# Patient Record
Sex: Female | Born: 1965 | Race: Black or African American | Hispanic: No | Marital: Married | Smoking: Never smoker
Health system: Southern US, Community
[De-identification: ages and names within clinical notes are randomized; demographics above are authoritative.]

## PROBLEM LIST (undated history)

## (undated) DIAGNOSIS — E119 Type 2 diabetes mellitus without complications: Secondary | ICD-10-CM

## (undated) DIAGNOSIS — Z5189 Encounter for other specified aftercare: Secondary | ICD-10-CM

## (undated) DIAGNOSIS — I10 Essential (primary) hypertension: Secondary | ICD-10-CM

## (undated) DIAGNOSIS — D569 Thalassemia, unspecified: Secondary | ICD-10-CM

## (undated) DIAGNOSIS — E785 Hyperlipidemia, unspecified: Secondary | ICD-10-CM

## (undated) HISTORY — PX: APPENDECTOMY: SHX54

## (undated) HISTORY — DX: Type 2 diabetes mellitus without complications: E11.9

## (undated) HISTORY — DX: Essential (primary) hypertension: I10

## (undated) HISTORY — DX: Hyperlipidemia, unspecified: E78.5

## (undated) HISTORY — DX: Thalassemia, unspecified: D56.9

## (undated) HISTORY — DX: Encounter for other specified aftercare: Z51.89

---

## 2015-04-16 ENCOUNTER — Encounter (HOSPITAL_COMMUNITY): Payer: Self-pay | Admitting: *Deleted

## 2015-04-16 ENCOUNTER — Emergency Department (HOSPITAL_COMMUNITY)
Admission: EM | Admit: 2015-04-16 | Discharge: 2015-04-16 | Disposition: A | Discharge status: Home / Self Care | Payer: No Typology Code available for payment source | Attending: Emergency Medicine | Admitting: Emergency Medicine

## 2015-04-16 DIAGNOSIS — Y92410 Unspecified street and highway as the place of occurrence of the external cause: Secondary | ICD-10-CM | POA: Insufficient documentation

## 2015-04-16 DIAGNOSIS — S199XXA Unspecified injury of neck, initial encounter: Secondary | ICD-10-CM | POA: Insufficient documentation

## 2015-04-16 DIAGNOSIS — Y939 Activity, unspecified: Secondary | ICD-10-CM | POA: Insufficient documentation

## 2015-04-16 DIAGNOSIS — Y999 Unspecified external cause status: Secondary | ICD-10-CM | POA: Diagnosis not present

## 2015-04-16 DIAGNOSIS — S46912A Strain of unspecified muscle, fascia and tendon at shoulder and upper arm level, left arm, initial encounter: Secondary | ICD-10-CM | POA: Insufficient documentation

## 2015-04-16 DIAGNOSIS — S4992XA Unspecified injury of left shoulder and upper arm, initial encounter: Secondary | ICD-10-CM | POA: Diagnosis present

## 2015-04-16 DIAGNOSIS — S46812A Strain of other muscles, fascia and tendons at shoulder and upper arm level, left arm, initial encounter: Secondary | ICD-10-CM

## 2015-04-16 MED ORDER — NAPROXEN 500 MG PO TABS: 500.0000 mg | ORAL_TABLET | Freq: Two times a day (BID) | ORAL | Status: DC

## 2015-04-16 MED ORDER — METHOCARBAMOL 500 MG PO TABS: 500.0000 mg | ORAL_TABLET | Freq: Two times a day (BID) | ORAL | Status: DC

## 2015-04-16 NOTE — Discharge Instructions (Signed)
1. Medications: robaxin, naproxyn, usual home medications 2. Treatment: rest, drink plenty of fluids, gentle stretching as discussed, alternate ice and heat 3. Follow Up: Please followup with your primary doctor in 3 days for discussion of your diagnoses and further evaluation after today's visit; if you do not have a primary care doctor use the resource guide provided to find one;  Return to the ER for worsening back pain, difficulty walking, loss of bowel or bladder control or other concerning symptoms    Back Exercises Back exercises help treat and prevent back injuries. The goal of back exercises is to increase the strength of your abdominal and back muscles and the flexibility of your back. These exercises should be started when you no longer have back pain. Back exercises include:  Pelvic Tilt. Lie on your back with your knees bent. Tilt your pelvis until the lower part of your back is against the floor. Hold this position 5 to 10 sec and repeat 5 to 10 times.  Knee to Chest. Pull first 1 knee up against your chest and hold for 20 to 30 seconds, repeat this with the other knee, and then both knees. This may be done with the other leg straight or bent, whichever feels better.  Sit-Ups or Curl-Ups. Bend your knees 90 degrees. Start with tilting your pelvis, and do a partial, slow sit-up, lifting your trunk only 30 to 45 degrees off the floor. Take at least 2 to 3 seconds for each sit-up. Do not do sit-ups with your knees out straight. If partial sit-ups are difficult, simply do the above but with only tightening your abdominal muscles and holding it as directed.  Hip-Lift. Lie on your back with your knees flexed 90 degrees. Push down with your feet and shoulders as you raise your hips a couple inches off the floor; hold for 10 seconds, repeat 5 to 10 times.  Back arches. Lie on your stomach, propping yourself up on bent elbows. Slowly press on your hands, causing an arch in your low back. Repeat  3 to 5 times. Any initial stiffness and discomfort should lessen with repetition over time.  Shoulder-Lifts. Lie face down with arms beside your body. Keep hips and torso pressed to floor as you slowly lift your head and shoulders off the floor. Do not overdo your exercises, especially in the beginning. Exercises may cause you some mild back discomfort which lasts for a few minutes; however, if the pain is more severe, or lasts for more than 15 minutes, do not continue exercises until you see your caregiver. Improvement with exercise therapy for back problems is slow.  See your caregivers for assistance with developing a proper back exercise program. Document Released: 09/17/2004 Document Revised: 11/02/2011 Document Reviewed: 06/11/2011 [facility] Patient Information 2015 [place], [place]. This information is not intended to replace advice given to you by your health care provider. Make sure you discuss any questions you have with your health care provider.
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
## 2015-04-16 NOTE — ED Notes (Signed)
Declined W/C at D/C and was escorted to lobby by RN. 
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
## 2015-09-03 ENCOUNTER — Emergency Department (HOSPITAL_COMMUNITY)
Admission: EM | Admit: 2015-09-03 | Discharge: 2015-09-04 | Discharge status: Against Medical Advice | Payer: Self-pay | Attending: Emergency Medicine | Admitting: Emergency Medicine

## 2015-09-03 ENCOUNTER — Encounter (HOSPITAL_COMMUNITY): Payer: Self-pay | Admitting: Emergency Medicine

## 2015-09-03 DIAGNOSIS — R1030 Lower abdominal pain, unspecified: Secondary | ICD-10-CM | POA: Insufficient documentation

## 2015-09-03 DIAGNOSIS — R51 Headache: Secondary | ICD-10-CM | POA: Insufficient documentation

## 2015-09-03 LAB — COMPREHENSIVE METABOLIC PANEL
ALT: 20 U/L (ref 14–54)
AST: 24 U/L (ref 15–41)
Albumin: 3.9 g/dL (ref 3.5–5.0)
Alkaline Phosphatase: 83 U/L (ref 38–126)
Anion gap: 12 (ref 5–15)
BUN: 11 mg/dL (ref 6–20)
CO2: 25 mmol/L (ref 22–32)
Calcium: 9.5 mg/dL (ref 8.9–10.3)
Chloride: 101 mmol/L (ref 101–111)
Creatinine, Ser: 0.71 mg/dL (ref 0.44–1.00)
GFR calc Af Amer: >60 mL/min (ref >60)
GFR calc non Af Amer: >60 mL/min (ref >60)
Glucose, Bld: 131 mg/dL — ABNORMAL HIGH (ref 65–99)
Potassium: 3.8 mmol/L (ref 3.5–5.1)
Sodium: 138 mmol/L (ref 135–145)
Total Bilirubin: 0.3 mg/dL (ref 0.3–1.2)
Total Protein: 7.7 g/dL (ref 6.5–8.1)

## 2015-09-03 LAB — LIPASE, BLOOD: Lipase: 45 U/L (ref 11–51)

## 2015-09-03 LAB — CBC
HCT: 35 % — ABNORMAL LOW (ref 36.0–46.0)
Hemoglobin: 11.1 g/dL — ABNORMAL LOW (ref 12.0–15.0)
MCH: 22.3 pg — ABNORMAL LOW (ref 26.0–34.0)
MCHC: 31.7 g/dL (ref 30.0–36.0)
MCV: 70.4 fL — ABNORMAL LOW (ref 78.0–100.0)
Platelets: 96 10*3/uL — ABNORMAL LOW (ref 150–400)
RBC: 4.97 MIL/uL (ref 3.87–5.11)
RDW: 15.7 % — ABNORMAL HIGH (ref 11.5–15.5)
WBC: 3.7 10*3/uL — ABNORMAL LOW (ref 4.0–10.5)

## 2015-09-03 NOTE — ED Notes (Signed)
Pt states last night she started having some lower abd pain across her abd and generalized body aches. Pt also reports a headache and states she just feels cold.

## 2015-09-03 NOTE — ED Notes (Signed)
Pt stated that she couldn't wait any longer and that she is uncomfortable. I explained the wait to the pt and how long the current time is and how many people are ahead of her. Pt stated that she wasn't going to wait

## 2015-09-04 NOTE — ED Notes (Signed)
Pt returned to be seen by the doctor. Pt moved back into waiting room

## 2015-09-04 NOTE — ED Notes (Signed)
No asnwer x 2

## 2015-09-04 NOTE — ED Notes (Signed)
Pts name called for a room no answer 

## 2015-10-30 ENCOUNTER — Inpatient Hospital Stay (HOSPITAL_COMMUNITY)
Admission: AD | Admit: 2015-10-30 | Discharge: 2015-10-31 | Disposition: A | Discharge status: Home / Self Care | Payer: No Typology Code available for payment source | Source: Ambulatory Visit | Attending: Obstetrics and Gynecology | Admitting: Obstetrics and Gynecology

## 2015-10-30 ENCOUNTER — Encounter (HOSPITAL_COMMUNITY): Payer: Self-pay | Admitting: *Deleted

## 2015-10-30 DIAGNOSIS — R109 Unspecified abdominal pain: Secondary | ICD-10-CM | POA: Insufficient documentation

## 2015-10-30 DIAGNOSIS — K219 Gastro-esophageal reflux disease without esophagitis: Secondary | ICD-10-CM | POA: Insufficient documentation

## 2015-10-30 LAB — COMPREHENSIVE METABOLIC PANEL
ALT: 21 U/L (ref 14–54)
AST: 23 U/L (ref 15–41)
Albumin: 3.9 g/dL (ref 3.5–5.0)
Alkaline Phosphatase: 72 U/L (ref 38–126)
Anion gap: 3 — ABNORMAL LOW (ref 5–15)
BUN: 10 mg/dL (ref 6–20)
CO2: 28 mmol/L (ref 22–32)
Calcium: 8.8 mg/dL — ABNORMAL LOW (ref 8.9–10.3)
Chloride: 108 mmol/L (ref 101–111)
Creatinine, Ser: 0.63 mg/dL (ref 0.44–1.00)
GFR calc Af Amer: >60 mL/min (ref >60)
GFR calc non Af Amer: >60 mL/min (ref >60)
Glucose, Bld: 134 mg/dL — ABNORMAL HIGH (ref 65–99)
Potassium: 3.9 mmol/L (ref 3.5–5.1)
Sodium: 139 mmol/L (ref 135–145)
Total Bilirubin: 0.6 mg/dL (ref 0.3–1.2)
Total Protein: 7.4 g/dL (ref 6.5–8.1)

## 2015-10-30 LAB — URINALYSIS, ROUTINE W REFLEX MICROSCOPIC
Bilirubin Urine: NEGATIVE
Glucose, UA: NEGATIVE mg/dL
Hgb urine dipstick: NEGATIVE
Ketones, ur: NEGATIVE mg/dL
Leukocytes, UA: NEGATIVE
Nitrite: NEGATIVE
Protein, ur: NEGATIVE mg/dL
Specific Gravity, Urine: <1.005 — ABNORMAL LOW (ref 1.005–1.030)
pH: 6 (ref 5.0–8.0)

## 2015-10-30 LAB — AMYLASE: Amylase: 116 U/L — ABNORMAL HIGH (ref 28–100)

## 2015-10-30 LAB — CBC
HCT: 33.2 % — ABNORMAL LOW (ref 36.0–46.0)
Hemoglobin: 10.3 g/dL — ABNORMAL LOW (ref 12.0–15.0)
MCH: 21.7 pg — ABNORMAL LOW (ref 26.0–34.0)
MCHC: 31 g/dL (ref 30.0–36.0)
MCV: 69.9 fL — ABNORMAL LOW (ref 78.0–100.0)
Platelets: 95 10*3/uL — ABNORMAL LOW (ref 150–400)
RBC: 4.75 MIL/uL (ref 3.87–5.11)
RDW: 15.9 % — ABNORMAL HIGH (ref 11.5–15.5)
WBC: 4.6 10*3/uL (ref 4.0–10.5)

## 2015-10-30 LAB — POCT PREGNANCY, URINE: Preg Test, Ur: NEGATIVE

## 2015-10-30 LAB — LIPASE, BLOOD: Lipase: 39 U/L (ref 11–51)

## 2015-10-30 MED ORDER — GI COCKTAIL ~~LOC~~
30.0000 mL | Freq: Once | ORAL | Status: AC
  Administered 2015-10-30: 30 mL via ORAL
  Filled 2015-10-30: qty 30

## 2015-10-30 MED ORDER — FAMOTIDINE 20 MG PO TABS: 20.0000 mg | ORAL_TABLET | Freq: Two times a day (BID) | ORAL | Status: DC

## 2015-10-30 NOTE — MAU Note (Signed)
Pt. States for a few days had indigestion. Had vomiting after brushing her teeth for 3-4 days. Yesterday started to have abdominal pain that was severe. Pt. States she has been drinking apple cider vinegar and was unsure if the reflux was caused by this. Also, has not been eating a lot. Denies bleeding and denies abnormal discharge. Unable to stand up right or stretch. Here for evaluation.

## 2015-10-30 NOTE — Discharge Instructions (Signed)

## 2015-10-30 NOTE — MAU Provider Note (Signed)
History     CSN: [id number]  Arrival date and time: 10/30/15 1831   First Provider Initiated Contact with Patient 10/30/15 1911      Chief Complaint  Patient presents with  . Abdominal Pain   Abdominal Pain This is a new problem. The current episode started yesterday. The onset quality is gradual. The problem occurs constantly. The pain is located in the periumbilical region and suprapubic region. The pain is moderate. The quality of the pain is sharp. The abdominal pain radiates to the periumbilical region. Associated symptoms include vomiting (when brushing teeth ). Pertinent negatives include no constipation (last BM a couple hours PTA ), diarrhea, dysuria, fever or nausea. The pain is aggravated by deep breathing, movement and certain positions. The pain is relieved by being still. Treatments tried: pepto-bismul  The treatment provided no relief. Her past medical history is significant for GERD. There is no history of pancreatitis.     Past Medical History  Diagnosis Date  . Medical history non-contributory     Past Surgical History  Procedure Laterality Date  . Appendectomy    . Cesarean section      History reviewed. No pertinent family history.  Social History  Substance Use Topics  . Smoking status: Never Smoker   . Smokeless tobacco: None  . Alcohol Use: No    Allergies:  Allergies  Allergen Reactions  . Sulfa Antibiotics Hives    Prescriptions prior to admission  Medication Sig Dispense Refill Last Dose  . diphenhydramine-acetaminophen (TYLENOL PM) 25-500 MG TABS tablet Take 2 tablets by mouth at bedtime as needed (pain).   Past Week at Unknown time  . methocarbamol (ROBAXIN) 500 MG tablet Take 1 tablet (500 mg total) by mouth 2 (two) times daily. (Patient not taking: Reported on 10/30/2015) 20 tablet 0   . naproxen (NAPROSYN) 500 MG tablet Take 1 tablet (500 mg total) by mouth 2 (two) times daily with a meal. (Patient not taking: Reported on 10/30/2015) 30  tablet 0     Review of Systems  Constitutional: Negative for fever and chills.  Gastrointestinal: Positive for vomiting (when brushing teeth ) and abdominal pain. Negative for nausea, diarrhea and constipation (last BM a couple hours PTA ).  Genitourinary: Negative for dysuria.   Physical Exam   Blood pressure 159/100, pulse 81, temperature 97.9 F (36.6 C), temperature source Oral, resp. rate 18.  Physical Exam  Nursing note and vitals reviewed. Constitutional: She is oriented to person, place, and time. She appears well-developed and well-nourished. No distress.  HENT:  Head: Normocephalic.  Cardiovascular: Normal rate, regular rhythm, normal heart sounds and intact distal pulses.   Respiratory: Effort normal and breath sounds normal.  GI: Soft. Bowel sounds are normal. There is tenderness (througout, RLQ, LLQ and periumbilical ).  Neurological: She is alert and oriented to person, place, and time.  Skin: Skin is warm and dry.  Psychiatric: She has a normal mood and affect.   Results for orders placed or performed during the hospital encounter of 10/30/15 (from the past 24 hour(s))  Urinalysis, Routine w reflex microscopic (not at [facility])     Status: Abnormal   Collection Time: 10/30/15  6:46 PM  Result Value Ref Range   Color, Urine STRAW (A) YELLOW   APPearance CLEAR CLEAR   Specific Gravity, Urine <1.005 (L) 1.005 - 1.030   pH 6.0 5.0 - 8.0   Glucose, UA NEGATIVE NEGATIVE mg/dL   Hgb urine dipstick NEGATIVE NEGATIVE   Bilirubin Urine NEGATIVE  NEGATIVE   Ketones, ur NEGATIVE NEGATIVE mg/dL   Protein, ur NEGATIVE NEGATIVE mg/dL   Nitrite NEGATIVE NEGATIVE   Leukocytes, UA NEGATIVE NEGATIVE  Pregnancy, urine POC     Status: None   Collection Time: 10/30/15  6:58 PM  Result Value Ref Range   Preg Test, Ur NEGATIVE NEGATIVE  CBC     Status: Abnormal   Collection Time: 10/30/15  7:40 PM  Result Value Ref Range   WBC 4.6 4.0 - 10.5 K/uL   RBC 4.75 3.87 - 5.11 MIL/uL    Hemoglobin 10.3 (L) 12.0 - 15.0 g/dL   HCT 33.2 (L) 36.0 - 46.0 %   MCV 69.9 (L) 78.0 - 100.0 fL   MCH 21.7 (L) 26.0 - 34.0 pg   MCHC 31.0 30.0 - 36.0 g/dL   RDW 15.9 (H) 11.5 - 15.5 %   Platelets 95 (L) 150 - 400 K/uL  Comprehensive metabolic panel     Status: Abnormal   Collection Time: 10/30/15  7:40 PM  Result Value Ref Range   Sodium 139 135 - 145 mmol/L   Potassium 3.9 3.5 - 5.1 mmol/L   Chloride 108 101 - 111 mmol/L   CO2 28 22 - 32 mmol/L   Glucose, Bld 134 (H) 65 - 99 mg/dL   BUN 10 6 - 20 mg/dL   Creatinine, Ser 0.63 0.44 - 1.00 mg/dL   Calcium 8.8 (L) 8.9 - 10.3 mg/dL   Total Protein 7.4 6.5 - 8.1 g/dL   Albumin 3.9 3.5 - 5.0 g/dL   AST 23 15 - 41 U/L   ALT 21 14 - 54 U/L   Alkaline Phosphatase 72 38 - 126 U/L   Total Bilirubin 0.6 0.3 - 1.2 mg/dL   GFR calc non Af Amer >60 >60 mL/min   GFR calc Af Amer >60 >60 mL/min   Anion gap 3 (L) 5 - 15  Amylase     Status: Abnormal   Collection Time: 10/30/15  7:40 PM  Result Value Ref Range   Amylase 116 (H) 28 - 100 U/L  Lipase, blood     Status: None   Collection Time: 10/30/15  7:40 PM  Result Value Ref Range   Lipase 39 11 - 51 U/L    MAU Course  Procedures  MDM 2138: Patient has had GI cocktail. She reports that her pain is better at this time.   Assessment and Plan   1. Gastroesophageal reflux disease, esophagitis presence not specified    DC home Comfort measures reviewed  RX: Pepcid 20mg  BID #30    Follow-up Information    Follow up with [facility].   Specialty:  Urgent Care   Why:  If symptoms worsen   Contact information:   [address] [phone]       Mathis Bud 10/30/2015, 8:17 PM

## 2015-11-01 ENCOUNTER — Inpatient Hospital Stay (HOSPITAL_COMMUNITY)
Admission: EM | Admit: 2015-11-01 | Discharge: 2015-11-04 | DRG: 392 | Disposition: A | Discharge status: Home / Self Care | Payer: Self-pay | Attending: Internal Medicine | Admitting: Internal Medicine

## 2015-11-01 ENCOUNTER — Emergency Department (HOSPITAL_COMMUNITY): Payer: Self-pay

## 2015-11-01 ENCOUNTER — Encounter (HOSPITAL_COMMUNITY): Payer: Self-pay

## 2015-11-01 DIAGNOSIS — Q433 Congenital malformations of intestinal fixation: Secondary | ICD-10-CM

## 2015-11-01 DIAGNOSIS — D259 Leiomyoma of uterus, unspecified: Secondary | ICD-10-CM | POA: Diagnosis present

## 2015-11-01 DIAGNOSIS — I10 Essential (primary) hypertension: Secondary | ICD-10-CM | POA: Diagnosis present

## 2015-11-01 DIAGNOSIS — D569 Thalassemia, unspecified: Secondary | ICD-10-CM | POA: Insufficient documentation

## 2015-11-01 DIAGNOSIS — Z882 Allergy status to sulfonamides status: Secondary | ICD-10-CM

## 2015-11-01 DIAGNOSIS — D696 Thrombocytopenia, unspecified: Secondary | ICD-10-CM | POA: Diagnosis present

## 2015-11-01 DIAGNOSIS — K529 Noninfective gastroenteritis and colitis, unspecified: Principal | ICD-10-CM | POA: Diagnosis present

## 2015-11-01 DIAGNOSIS — N760 Acute vaginitis: Secondary | ICD-10-CM | POA: Diagnosis present

## 2015-11-01 DIAGNOSIS — D509 Iron deficiency anemia, unspecified: Secondary | ICD-10-CM | POA: Diagnosis present

## 2015-11-01 DIAGNOSIS — R1084 Generalized abdominal pain: Secondary | ICD-10-CM | POA: Insufficient documentation

## 2015-11-01 DIAGNOSIS — Z79899 Other long term (current) drug therapy: Secondary | ICD-10-CM

## 2015-11-01 DIAGNOSIS — K8020 Calculus of gallbladder without cholecystitis without obstruction: Secondary | ICD-10-CM | POA: Diagnosis present

## 2015-11-01 LAB — WET PREP, GENITAL
Sperm: NONE SEEN
Trich, Wet Prep: NONE SEEN
Yeast Wet Prep HPF POC: NONE SEEN

## 2015-11-01 LAB — URINALYSIS, ROUTINE W REFLEX MICROSCOPIC
Bilirubin Urine: NEGATIVE
Glucose, UA: >1000 mg/dL — AB
Hgb urine dipstick: NEGATIVE
Ketones, ur: NEGATIVE mg/dL
Leukocytes, UA: NEGATIVE
Nitrite: NEGATIVE
Protein, ur: NEGATIVE mg/dL
Specific Gravity, Urine: 1.024 (ref 1.005–1.030)
pH: 5.5 (ref 5.0–8.0)

## 2015-11-01 LAB — CBC
HCT: 36 % (ref 36.0–46.0)
Hemoglobin: 11 g/dL — ABNORMAL LOW (ref 12.0–15.0)
MCH: 21.8 pg — ABNORMAL LOW (ref 26.0–34.0)
MCHC: 30.6 g/dL (ref 30.0–36.0)
MCV: 71.3 fL — ABNORMAL LOW (ref 78.0–100.0)
Platelets: 105 10*3/uL — ABNORMAL LOW (ref 150–400)
RBC: 5.05 MIL/uL (ref 3.87–5.11)
RDW: 15.7 % — ABNORMAL HIGH (ref 11.5–15.5)
WBC: 4.5 10*3/uL (ref 4.0–10.5)

## 2015-11-01 LAB — I-STAT BETA HCG BLOOD, ED (MC, WL, AP ONLY)
I-stat hCG, quantitative: <5 m[IU]/mL (ref <5)
I-stat hCG, quantitative: <5 m[IU]/mL (ref <5)

## 2015-11-01 LAB — COMPREHENSIVE METABOLIC PANEL
ALT: 20 U/L (ref 14–54)
AST: 20 U/L (ref 15–41)
Albumin: 4.1 g/dL (ref 3.5–5.0)
Alkaline Phosphatase: 72 U/L (ref 38–126)
Anion gap: 7 (ref 5–15)
BUN: 11 mg/dL (ref 6–20)
CO2: 26 mmol/L (ref 22–32)
Calcium: 9.3 mg/dL (ref 8.9–10.3)
Chloride: 107 mmol/L (ref 101–111)
Creatinine, Ser: 0.66 mg/dL (ref 0.44–1.00)
GFR calc Af Amer: >60 mL/min (ref >60)
GFR calc non Af Amer: >60 mL/min (ref >60)
Glucose, Bld: 204 mg/dL — ABNORMAL HIGH (ref 65–99)
Potassium: 3.6 mmol/L (ref 3.5–5.1)
Sodium: 140 mmol/L (ref 135–145)
Total Bilirubin: 0.3 mg/dL (ref 0.3–1.2)
Total Protein: 7.8 g/dL (ref 6.5–8.1)

## 2015-11-01 LAB — CBC WITH DIFFERENTIAL/PLATELET
Basophils Absolute: 0 10*3/uL (ref 0.0–0.1)
Basophils Relative: 0 %
Eosinophils Absolute: 0.1 10*3/uL (ref 0.0–0.7)
Eosinophils Relative: 2 %
HCT: 40.9 % (ref 36.0–46.0)
Hemoglobin: 12.4 g/dL (ref 12.0–15.0)
Lymphocytes Relative: 34 %
Lymphs Abs: 1 10*3/uL (ref 0.7–4.0)
MCH: 21.6 pg — ABNORMAL LOW (ref 26.0–34.0)
MCHC: 30.3 g/dL (ref 30.0–36.0)
MCV: 71.4 fL — ABNORMAL LOW (ref 78.0–100.0)
Monocytes Absolute: 0.2 10*3/uL (ref 0.1–1.0)
Monocytes Relative: 6 %
Neutro Abs: 1.7 10*3/uL (ref 1.7–7.7)
Neutrophils Relative %: 58 %
Platelets: 83 10*3/uL — ABNORMAL LOW (ref 150–400)
RBC: 5.73 MIL/uL — ABNORMAL HIGH (ref 3.87–5.11)
RDW: 15.8 % — ABNORMAL HIGH (ref 11.5–15.5)
WBC: 3 10*3/uL — ABNORMAL LOW (ref 4.0–10.5)

## 2015-11-01 LAB — URINE MICROSCOPIC-ADD ON
Bacteria, UA: NONE SEEN
RBC / HPF: NONE SEEN RBC/hpf (ref 0–5)
WBC, UA: NONE SEEN WBC/hpf (ref 0–5)

## 2015-11-01 LAB — LIPASE, BLOOD: Lipase: 43 U/L (ref 11–51)

## 2015-11-01 MED ORDER — MORPHINE SULFATE (PF) 4 MG/ML IV SOLN
4.0000 mg | Freq: Once | INTRAVENOUS | Status: AC
  Administered 2015-11-01: 4 mg via INTRAVENOUS
  Filled 2015-11-01: qty 1

## 2015-11-01 MED ORDER — SODIUM CHLORIDE 0.9 % IV BOLUS (SEPSIS)
1000.0000 mL | Freq: Once | INTRAVENOUS | Status: AC
  Administered 2015-11-01: 1000 mL via INTRAVENOUS

## 2015-11-01 MED ORDER — IOHEXOL 300 MG/ML  SOLN
100.0000 mL | Freq: Once | INTRAMUSCULAR | Status: AC | PRN
  Administered 2015-11-01: 100 mL via INTRAVENOUS

## 2015-11-01 NOTE — ED Notes (Signed)
Patient c/o generalized abdominal pain. Patient denies N/V/D. Patient was seen at [facility] 2 days ago and was diagnosed with GERD. Patient states she was given Pepcid, but is not helping. Patient states the pain is worse.

## 2015-11-01 NOTE — ED Provider Notes (Signed)
CSN: [id number]     Arrival date & time 11/01/15  1513 History   First MD Initiated Contact with Patient 11/01/15 2039     Chief Complaint  Patient presents with  . Abdominal Pain     (Consider location/radiation/quality/duration/timing/severity/associated sxs/prior Treatment) HPI Patient has had pain for approximately 4 days. She reports pain was initially epigastric. She reports it has gotten worse and now spread out to encompass more for central abdomen. She has had occasional vomiting but none recurrently. She reports bowel movements were normal but yesterday it looked more dark. No pain burning or urgency as urination. No vaginal discharge or bleeding. Pain is worse with movements. She has got no relief with Pepcid. She has history of appendectomy and C-section. Otherwise healthy. Past Medical History  Diagnosis Date  . Medical history non-contributory    Past Surgical History  Procedure Laterality Date  . Appendectomy    . Cesarean section     Family History  Problem Relation Age of Onset  . Family history unknown: Yes   Social History  Substance Use Topics  . Smoking status: Never Smoker   . Smokeless tobacco: Never Used  . Alcohol Use: No   OB History    Gravida Para Term Preterm AB TAB SAB Ectopic Multiple Living   6         2      Obstetric Comments   History of stillbirth     Review of Systems  10 Systems reviewed and are negative for acute change except as noted in the HPI.   Allergies  Sulfa antibiotics  Home Medications   Prior to Admission medications   Medication Sig Start Date End Date Taking? Authorizing Provider  diphenhydramine-acetaminophen (TYLENOL PM) 25-500 MG TABS tablet Take 2 tablets by mouth at bedtime as needed (pain).   Yes Historical Provider, MD  famotidine (PEPCID) 20 MG tablet Take 1 tablet (20 mg total) by mouth 2 (two) times daily. 10/30/15  Yes Heather D Hogan, CNM   BP 148/99 mmHg  Pulse 79  Temp(Src) 97.5 F (36.4 C) (Oral)   Resp 18  Ht 5\' 4"  (1.626 m)  Wt 170 lb (77.111 kg)  BMI 29.17 kg/m2  SpO2 99%  LMP  (LMP Unknown) Physical Exam  Constitutional: She is oriented to person, place, and time. She appears well-developed and well-nourished.  HENT:  Head: Normocephalic and atraumatic.  Eyes: EOM are normal. Pupils are equal, round, and reactive to light.  Neck: Neck supple.  Cardiovascular: Normal rate, regular rhythm, normal heart sounds and intact distal pulses.   Pulmonary/Chest: Effort normal and breath sounds normal.  Abdominal: Soft. Bowel sounds are normal. She exhibits no distension. There is tenderness.  Moderate to severe abdominal pain from the epigastrium to central lower abdomen. Voluntary guarding.  Genitourinary:  Normal external female genitalia. Speculum examination, white discharge in the vaginal vault. Cervix is nonfriable. Bimanual examination, mild diffuse uterine tenderness. Adnexa nontender.  Musculoskeletal: Normal range of motion. She exhibits tenderness. She exhibits no edema.  Neurological: She is alert and oriented to person, place, and time. She has normal strength. Coordination normal. GCS eye subscore is 4. GCS verbal subscore is 5. GCS motor subscore is 6.  Skin: Skin is warm, dry and intact.  Psychiatric: She has a normal mood and affect.    ED Course  Procedures (including critical care time) Labs Review Labs Reviewed  WET PREP, GENITAL - Abnormal; Notable for the following:    Clue Cells Wet Prep HPF  POC MANY (*)    WBC, Wet Prep HPF POC FEW (*)    All other components within normal limits  COMPREHENSIVE METABOLIC PANEL - Abnormal; Notable for the following:    Glucose, Bld 204 (*)    All other components within normal limits  CBC - Abnormal; Notable for the following:    Hemoglobin 11.0 (*)    MCV 71.3 (*)    MCH 21.8 (*)    RDW 15.7 (*)    Platelets 105 (*)    All other components within normal limits  URINALYSIS, ROUTINE W REFLEX MICROSCOPIC (NOT AT [facility]) -  Abnormal; Notable for the following:    APPearance CLOUDY (*)    Glucose, UA >1000 (*)    All other components within normal limits  URINE MICROSCOPIC-ADD ON - Abnormal; Notable for the following:    Squamous Epithelial / LPF 6-30 (*)    All other components within normal limits  CBC WITH DIFFERENTIAL/PLATELET - Abnormal; Notable for the following:    WBC 3.0 (*)    RBC 5.73 (*)    MCV 71.4 (*)    MCH 21.6 (*)    RDW 15.8 (*)    Platelets 83 (*)    All other components within normal limits  GASTROINTESTINAL PANEL BY PCR, STOOL (REPLACES STOOL CULTURE)  LIPASE, BLOOD  OCCULT BLOOD X 1 CARD TO LAB, STOOL  I-STAT BETA HCG BLOOD, ED ([facility], [facility], [facility] ONLY)  I-STAT BETA HCG BLOOD, ED ([facility], [facility], [facility] ONLY)  GC/CHLAMYDIA PROBE AMP () NOT AT [facility]    Imaging Review Ct Abdomen Pelvis W Contrast  11/01/2015  CLINICAL DATA:  50 year old female with generalized abdominal pain. No current nausea, vomiting or diarrhea. EXAM: CT ABDOMEN AND PELVIS WITH CONTRAST TECHNIQUE: Multidetector CT imaging of the abdomen and pelvis was performed using the standard protocol following bolus administration of intravenous contrast. CONTRAST:  115mL OMNIPAQUE IOHEXOL 300 MG/ML  SOLN COMPARISON:  No priors. FINDINGS: Lower chest:  Unremarkable. Hepatobiliary: No cystic or solid hepatic lesions. No intra or extrahepatic biliary ductal dilatation. Calcified gallstones in the gallbladder. No current findings to suggest an acute cholecystitis at this time. Pancreas: No pancreatic mass. No pancreatic ductal dilatation. No pancreatic or peripancreatic fluid or inflammatory changes. Spleen: Unremarkable. Adrenals/Urinary Tract: 10 mm simple cyst in the upper pole of the right kidney. Left kidney and bilateral adrenal glands are normal in appearance. No hydroureteronephrosis. Urinary bladder is normal in appearance. Stomach/Bowel: Normal appearance of the stomach. No pathologic dilatation of small bowel or colon. There are  several loops of small bowel including the third and fourth portions of the duodenum, and proximal aspects of the jejunum which appear thickened, best appreciated in the right side of the abdomen on image 45 of series 2. Although the duodenum crosses the midline beneath the superior mesenteric artery, the third/fourth portion of the duodenum do not extend upward and the duodenal jejunal junction lie is well below the level of the pylorus. 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 Colon is normally located. Appendix is not confidently identified, likely surgically absent. Regardless, there are no inflammatory changes adjacent to the cecum to suggest presence of an acute appendicitis at this time. Vascular/Lymphatic: Minimal atherosclerotic disease is noted throughout the abdominal and pelvic vasculature, without evidence of aneurysm or dissection. No lymphadenopathy is noted in the abdomen or pelvis. Reproductive: Uterus is slightly heterogeneous in appearance, likely to  reflect the presence of multiple small fibroids, largest of which is in the right side of the uterine body measuring up to 3.8 cm. Ovaries are unremarkable in appearance. Other: No significant volume of ascites.  No pneumoperitoneum. Musculoskeletal: There are no aggressive appearing lytic or blastic lesions noted in the visualized portions of the skeleton. IMPRESSION: 1. Mural thickening of small bowel involving portions of the duodenum and proximal jejunum, as above, suggestive of enteritis. 2. No findings to suggest small bowel obstruction at this time. 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 No findings to suggest frank midgut volvulus. 3. Cholelithiasis without evidence of acute cholecystitis at this time. 4. Fibroid uterus. 5. Additional incidental findings, as above. Electronically Signed   By:  Vinnie Langton M.D.   On: 11/01/2015 23:38   I have personally reviewed and evaluated these images and lab results as part of my medical decision-making.   EKG Interpretation None     Consult: Discussed with general surgery Dr. Brantley Stage, at this point will admit the patient for fluids and pain control. Patient will be seen in the morning for consult. CT does not show immediate surgical condition. MDM   Final diagnoses:  Generalized abdominal pain  Enteritis   Patient has had worsening abdominal pain for 4 days. Past surgical history is for appendectomy and C-section with otherwise healthy 50 year old female. CT shows several areas of inflammatory change but no obstruction identified. With continued pain, the patient will be admitted for IV fluids and pain control with surgical consultation.    Charlesetta Shanks, MD 11/02/15 [phone]

## 2015-11-02 ENCOUNTER — Encounter (HOSPITAL_COMMUNITY): Payer: Self-pay | Admitting: Rehabilitation

## 2015-11-02 DIAGNOSIS — D259 Leiomyoma of uterus, unspecified: Secondary | ICD-10-CM | POA: Insufficient documentation

## 2015-11-02 DIAGNOSIS — D696 Thrombocytopenia, unspecified: Secondary | ICD-10-CM

## 2015-11-02 DIAGNOSIS — K529 Noninfective gastroenteritis and colitis, unspecified: Principal | ICD-10-CM

## 2015-11-02 DIAGNOSIS — D509 Iron deficiency anemia, unspecified: Secondary | ICD-10-CM

## 2015-11-02 DIAGNOSIS — R1084 Generalized abdominal pain: Secondary | ICD-10-CM | POA: Insufficient documentation

## 2015-11-02 DIAGNOSIS — D569 Thalassemia, unspecified: Secondary | ICD-10-CM | POA: Insufficient documentation

## 2015-11-02 MED ORDER — ONDANSETRON HCL 4 MG/2ML IJ SOLN: 4.0000 mg | Freq: Four times a day (QID) | INTRAMUSCULAR | Status: DC | PRN

## 2015-11-02 MED ORDER — CIPROFLOXACIN IN D5W 400 MG/200ML IV SOLN
400.0000 mg | Freq: Two times a day (BID) | INTRAVENOUS | Status: DC
  Administered 2015-11-02 – 2015-11-04 (×4): 400 mg via INTRAVENOUS
  Filled 2015-11-02 (×6): qty 200

## 2015-11-02 MED ORDER — MORPHINE SULFATE (PF) 4 MG/ML IV SOLN
4.0000 mg | Freq: Once | INTRAVENOUS | Status: AC
  Administered 2015-11-02: 4 mg via INTRAVENOUS
  Filled 2015-11-02: qty 1

## 2015-11-02 MED ORDER — MORPHINE SULFATE (PF) 2 MG/ML IV SOLN
2.0000 mg | INTRAVENOUS | Status: DC | PRN
  Administered 2015-11-02 – 2015-11-04 (×2): 2 mg via INTRAVENOUS
  Filled 2015-11-02 (×2): qty 1

## 2015-11-02 MED ORDER — METRONIDAZOLE IN NACL 5-0.79 MG/ML-% IV SOLN
500.0000 mg | Freq: Three times a day (TID) | INTRAVENOUS | Status: DC
  Administered 2015-11-02 – 2015-11-04 (×7): 500 mg via INTRAVENOUS
  Filled 2015-11-02 (×8): qty 100

## 2015-11-02 MED ORDER — SODIUM CHLORIDE 0.9 % IV SOLN
INTRAVENOUS | Status: DC
  Administered 2015-11-02: 01:00:00 via INTRAVENOUS

## 2015-11-02 MED ORDER — ENOXAPARIN SODIUM 40 MG/0.4ML ~~LOC~~ SOLN
40.0000 mg | SUBCUTANEOUS | Status: DC
  Administered 2015-11-02 – 2015-11-03 (×2): 40 mg via SUBCUTANEOUS
  Filled 2015-11-02 (×3): qty 0.4

## 2015-11-02 MED ORDER — ONDANSETRON HCL 4 MG PO TABS: 4.0000 mg | ORAL_TABLET | Freq: Four times a day (QID) | ORAL | Status: DC | PRN

## 2015-11-02 NOTE — ED Notes (Signed)
Attempted to collect stool, no stool was able to be obtained.

## 2015-11-02 NOTE — Progress Notes (Signed)
[facility]                                                                              Patient Demographics  Christina Kirby, is a 50 y.o. female, DOB - Jul 22, 1966, [id number]  Admit date - 11/01/2015   Admitting Physician Etta Quill, DO  Outpatient Primary MD for the patient is No primary care provider on file.  LOS -    Chief Complaint  Patient presents with  . Abdominal Pain      HPI on 11/02/2015 by Dr. Jennette Kettle Christina Kirby is a 50 y.o. female who presents to the ED with 4 day history of abdominal pain. Pain initially epigastric. It has gotten worse and now spread out to central abdomen. No V/D, patient presents to ED for worsening symptoms.  Assessment & Plan   Abdominal pain secondary to enteritis -CT abd/pelvis: Thickening of small bowel including portions of duodenum and proximal jejunum, suggestive of enteritis. No SBO. Small bowel malrotation incidentally noted. Cholelithiasis without evidence of acute cholecystitis.   -Pending stool cultures -Continue cipro/flagyl -Continue pain control and antiemetics PRN -Currently on clear liquid diet, will advance to full -Spoke with general surgery, Dr. Marlou Starks.  No intervention needed for malrotation. Continue current treatment.   Chronic microcytic anemia -Baseline on hemoglobin 11 -Hemoglobin currently 12.4, continue to monitor CBC  Chronic thrombocytopenia -It was currently 83, continue monitor CBC -Patient will need outpatient follow-up and monitoring.  Uterine fibroids -Seen on CT abdomen and pelvis -Will need outpatient follow-up with gynecology  Bacterial vaginosis -Wet prep showed many clue cells -Patient currently on Flagyl  Code Status: Full  Family Communication: Children at bedside  Disposition Plan: Admitted  Time Spent in minutes   30 minutes  Procedures  None  Consults   General surgery, via phone  DVT Prophylaxis  lovenox  Lab Results  Component Value Date     PLT 83* 11/01/2015    Medications  Scheduled Meds: . ciprofloxacin  400 mg Intravenous Q12H  . enoxaparin (LOVENOX) injection  40 mg Subcutaneous Q24H  . metronidazole  500 mg Intravenous Q8H   Continuous Infusions: . sodium chloride 125 mL/hr at 11/02/15 0127   PRN Meds:.morphine injection, ondansetron **OR** ondansetron (ZOFRAN) IV  Antibiotics    Anti-infectives    Start     Dose/Rate Route Frequency Ordered Stop   11/02/15 0115  ciprofloxacin (CIPRO) IVPB 400 mg     400 mg 200 mL/hr over 60 Minutes Intravenous Every 12 hours 11/02/15 0102     11/02/15 0115  metroNIDAZOLE (FLAGYL) IVPB 500 mg     500 mg 100 mL/hr over 60 Minutes Intravenous Every 8 hours 11/02/15 0102        Subjective:   Christina Kirby seen and examined today.  Patient denies any chest pain, shortness of breath,  nausea or vomiting, diarrhea constipation. Feels her abdominal pain has improved.  Objective:   Filed Vitals:   11/02/15 0130 11/02/15 0155 11/02/15 0315 11/02/15 0548  BP: 128/71 128/71  130/86  Pulse: 75 67  80  Temp:    98.1 F (36.7 C)  TempSrc:    Oral  Resp:  18  20  Height:  Weight:   81.421 kg (179 lb 8 oz)   SpO2: 98% 99%  100%    Wt Readings from Last 3 Encounters:  11/02/15 81.421 kg (179 lb 8 oz)  09/03/15 77.111 kg (170 lb)    No intake or output data in the 24 hours ending 11/02/15 1328  Exam  General: Well developed, well nourished, NAD, appears stated age  HEENT: NCAT, mucous membranes moist.   Cardiovascular: S1 S2 auscultated, no rubs, murmurs or gallops. Regular rate and rhythm.  Respiratory: Clear to auscultation bilaterally with equal chest rise  Abdomen: Soft, Mild TTP, nondistended, + bowel sounds  Extremities: warm dry without cyanosis clubbing or edema  Neuro: AAOx3, Nonfocal  Psych: Normal affect and demeanor with intact judgement and insight  Data Review   Micro Results Recent Results (from the past 240 hour(s))  Wet prep,  genital     Status: Abnormal   Collection Time: 11/01/15  9:41 PM  Result Value Ref Range Status   Yeast Wet Prep HPF POC NONE SEEN NONE SEEN Final   Trich, Wet Prep NONE SEEN NONE SEEN Final   Clue Cells Wet Prep HPF POC MANY (A) NONE SEEN Final   WBC, Wet Prep HPF POC FEW (A) NONE SEEN Final   Sperm NONE SEEN  Final    Radiology Reports Ct Abdomen Pelvis W Contrast  11/01/2015  CLINICAL DATA:  50 year old female with generalized abdominal pain. No current nausea, vomiting or diarrhea. EXAM: CT ABDOMEN AND PELVIS WITH CONTRAST TECHNIQUE: Multidetector CT imaging of the abdomen and pelvis was performed using the standard protocol following bolus administration of intravenous contrast. CONTRAST:  175mL OMNIPAQUE IOHEXOL 300 MG/ML  SOLN COMPARISON:  No priors. FINDINGS: Lower chest:  Unremarkable. Hepatobiliary: No cystic or solid hepatic lesions. No intra or extrahepatic biliary ductal dilatation. Calcified gallstones in the gallbladder. No current findings to suggest an acute cholecystitis at this time. Pancreas: No pancreatic mass. No pancreatic ductal dilatation. No pancreatic or peripancreatic fluid or inflammatory changes. Spleen: Unremarkable. Adrenals/Urinary Tract: 10 mm simple cyst in the upper pole of the right kidney. Left kidney and bilateral adrenal glands are normal in appearance. No hydroureteronephrosis. Urinary bladder is normal in appearance. Stomach/Bowel: Normal appearance of the stomach. No pathologic dilatation of small bowel or colon. There are several loops of small bowel including the third and fourth portions of the duodenum, and proximal aspects of the jejunum which appear thickened, best appreciated in the right side of the abdomen on image 45 of series 2. Although the duodenum crosses the midline beneath the superior mesenteric artery, the third/fourth portion of the duodenum do not extend upward and the duodenal jejunal junction lie is well below the level of the pylorus.  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 Colon is normally located. Appendix is not confidently identified, likely surgically absent. Regardless, there are no inflammatory changes adjacent to the cecum to suggest presence of an acute appendicitis at this time. Vascular/Lymphatic: Minimal atherosclerotic disease is noted throughout the abdominal and pelvic vasculature, without evidence of aneurysm or dissection. No lymphadenopathy is noted in the abdomen or pelvis. Reproductive: Uterus is slightly heterogeneous in appearance, likely to reflect the presence of multiple small fibroids, largest of which is in the right side of the uterine body measuring up to 3.8 cm. Ovaries are unremarkable in appearance. Other: No significant volume of ascites.  No pneumoperitoneum. Musculoskeletal: There are no aggressive  appearing lytic or blastic lesions noted in the visualized portions of the skeleton. IMPRESSION: 1. Mural thickening of small bowel involving portions of the duodenum and proximal jejunum, as above, suggestive of enteritis. 2. No findings to suggest small bowel obstruction at this time. 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 No findings to suggest frank midgut volvulus. 3. Cholelithiasis without evidence of acute cholecystitis at this time. 4. Fibroid uterus. 5. Additional incidental findings, as above. Electronically Signed   By: Vinnie Langton M.D.   On: 11/01/2015 23:38    CBC  Recent Labs Lab 10/30/15 1940 11/01/15 1551 11/01/15 2105  WBC 4.6 4.5 3.0*  HGB 10.3* 11.0* 12.4  HCT 33.2* 36.0 40.9  PLT 95* 105* 83*  MCV 69.9* 71.3* 71.4*  MCH 21.7* 21.8* 21.6*  MCHC 31.0 30.6 30.3  RDW 15.9* 15.7* 15.8*  LYMPHSABS  --   --  1.0  MONOABS  --   --  0.2  EOSABS  --   --  0.1  BASOSABS  --   --  0.0    Chemistries   Recent Labs Lab  10/30/15 1940 11/01/15 1551  NA 139 140  K 3.9 3.6  CL 108 107  CO2 28 26  GLUCOSE 134* 204*  BUN 10 11  CREATININE 0.63 0.66  CALCIUM 8.8* 9.3  AST 23 20  ALT 21 20  ALKPHOS 72 72  BILITOT 0.6 0.3   ------------------------------------------------------------------------------------------------------------------ estimated creatinine clearance is 85.9 mL/min (by C-G formula based on Cr of 0.66). ------------------------------------------------------------------------------------------------------------------ No results for input(s): HGBA1C in the last 72 hours. ------------------------------------------------------------------------------------------------------------------ No results for input(s): CHOL, HDL, LDLCALC, TRIG, CHOLHDL, LDLDIRECT in the last 72 hours. ------------------------------------------------------------------------------------------------------------------ No results for input(s): TSH, T4TOTAL, T3FREE, THYROIDAB in the last 72 hours.  Invalid input(s): FREET3 ------------------------------------------------------------------------------------------------------------------ No results for input(s): VITAMINB12, FOLATE, FERRITIN, TIBC, IRON, RETICCTPCT in the last 72 hours.  Coagulation profile No results for input(s): INR, PROTIME in the last 168 hours.  No results for input(s): DDIMER in the last 72 hours.  Cardiac Enzymes No results for input(s): CKMB, TROPONINI, MYOGLOBIN in the last 168 hours.  Invalid input(s): CK ------------------------------------------------------------------------------------------------------------------ Invalid input(s): POCBNP    Tyisha Cressy D.O. on 11/02/2015 at 1:28 PM  Between 7am to 7pm - Pager - [phone]  After 7pm go to [email or web address] - password [email or web address]  And look for the night coverage person covering for me after hours  [facility] Office  [phone]

## 2015-11-02 NOTE — H&P (Signed)
[facility] History and Physical  [name] [id number] DOB: December 10, 1965 DOA: 11/01/2015  Referring physician: EDP PCP: No primary care provider on file.   Chief Complaint: Abdominal pain   HPI: Christina Kirby is a 50 y.o. female who presents to the ED with 4 day history of abdominal pain.  Pain initially epigastric.  It has gotten worse and now spread out to central abdomen.  No V/D, patient presents to ED for worsening symptoms.  Review of Systems: Systems reviewed.  As above, otherwise negative  Past Medical History  Diagnosis Date  . Medical history non-contributory    Past Surgical History  Procedure Laterality Date  . Appendectomy    . Cesarean section     Social History:  reports that she has never smoked. She has never used smokeless tobacco. She reports that she does not drink alcohol or use illicit drugs.  Allergies  Allergen Reactions  . Sulfa Antibiotics Hives    Family History  Problem Relation Age of Onset  . Family history unknown: Yes     Prior to Admission medications   Medication Sig Start Date End Date Taking? Authorizing Provider  diphenhydramine-acetaminophen (TYLENOL PM) 25-500 MG TABS tablet Take 2 tablets by mouth at bedtime as needed (pain).   Yes Historical Provider, MD  famotidine (PEPCID) 20 MG tablet Take 1 tablet (20 mg total) by mouth 2 (two) times daily. 10/30/15  Yes Tresea Mall, CNM   Physical Exam: Filed Vitals:   11/01/15 2327 11/01/15 2330  BP: 141/88 148/99  Pulse: 78 79  Temp:    Resp: 18     BP 148/99 mmHg  Pulse 79  Temp(Src) 97.5 F (36.4 C) (Oral)  Resp 18  Ht 5\' 4"  (1.626 m)  Wt 77.111 kg (170 lb)  BMI 29.17 kg/m2  SpO2 99%  LMP  (LMP Unknown)  General Appearance:    Alert, oriented, no distress, appears stated age  Head:    Normocephalic, atraumatic  Eyes:    PERRL, EOMI, sclera non-icteric        Nose:   Nares without drainage or epistaxis. Mucosa, turbinates normal  Throat:   Moist  mucous membranes. Oropharynx without erythema or exudate.  Neck:   Supple. No carotid bruits.  No thyromegaly.  No lymphadenopathy.   Back:     No CVA tenderness, no spinal tenderness  Lungs:     Clear to auscultation bilaterally, without wheezes, rhonchi or rales  Chest wall:    No tenderness to palpitation  Heart:    Regular rate and rhythm without murmurs, gallops, rubs  Abdomen:     Soft, non-tender, nondistended, normal bowel sounds, no organomegaly  Genitalia:    deferred  Rectal:    deferred  Extremities:   No clubbing, cyanosis or edema.  Pulses:   2+ and symmetric all extremities  Skin:   Skin color, texture, turgor normal, no rashes or lesions  Lymph nodes:   Cervical, supraclavicular, and axillary nodes normal  Neurologic:   CNII-XII intact. Normal strength, sensation and reflexes      throughout    Labs on Admission:  Basic Metabolic Panel:  Recent Labs Lab 10/30/15 1940 11/01/15 1551  NA 139 140  K 3.9 3.6  CL 108 107  CO2 28 26  GLUCOSE 134* 204*  BUN 10 11  CREATININE 0.63 0.66  CALCIUM 8.8* 9.3   Liver Function Tests:  Recent Labs Lab 10/30/15 1940 11/01/15 1551  AST 23 20  ALT 21 20  ALKPHOS  72 72  BILITOT 0.6 0.3  PROT 7.4 7.8  ALBUMIN 3.9 4.1    Recent Labs Lab 10/30/15 1940 11/01/15 1551  LIPASE 39 43  AMYLASE 116*  --    No results for input(s): AMMONIA in the last 168 hours. CBC:  Recent Labs Lab 10/30/15 1940 11/01/15 1551 11/01/15 2105  WBC 4.6 4.5 3.0*  NEUTROABS  --   --  1.7  HGB 10.3* 11.0* 12.4  HCT 33.2* 36.0 40.9  MCV 69.9* 71.3* 71.4*  PLT 95* 105* 83*   Cardiac Enzymes: No results for input(s): CKTOTAL, CKMB, CKMBINDEX, TROPONINI in the last 168 hours.  BNP (last 3 results) No results for input(s): PROBNP in the last 8760 hours. CBG: No results for input(s): GLUCAP in the last 168 hours.  Radiological Exams on Admission: Ct Abdomen Pelvis W Contrast  11/01/2015  CLINICAL DATA:  50 year old female with  generalized abdominal pain. No current nausea, vomiting or diarrhea. EXAM: CT ABDOMEN AND PELVIS WITH CONTRAST TECHNIQUE: Multidetector CT imaging of the abdomen and pelvis was performed using the standard protocol following bolus administration of intravenous contrast. CONTRAST:  145mL OMNIPAQUE IOHEXOL 300 MG/ML  SOLN COMPARISON:  No priors. FINDINGS: Lower chest:  Unremarkable. Hepatobiliary: No cystic or solid hepatic lesions. No intra or extrahepatic biliary ductal dilatation. Calcified gallstones in the gallbladder. No current findings to suggest an acute cholecystitis at this time. Pancreas: No pancreatic mass. No pancreatic ductal dilatation. No pancreatic or peripancreatic fluid or inflammatory changes. Spleen: Unremarkable. Adrenals/Urinary Tract: 10 mm simple cyst in the upper pole of the right kidney. Left kidney and bilateral adrenal glands are normal in appearance. No hydroureteronephrosis. Urinary bladder is normal in appearance. Stomach/Bowel: Normal appearance of the stomach. No pathologic dilatation of small bowel or colon. There are several loops of small bowel including the third and fourth portions of the duodenum, and proximal aspects of the jejunum which appear thickened, best appreciated in the right side of the abdomen on image 45 of series 2. Although the duodenum crosses the midline beneath the superior mesenteric artery, the third/fourth portion of the duodenum do not extend upward and the duodenal jejunal junction lie is well below the level of the pylorus. 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 Colon is normally located. Appendix is not confidently identified, likely surgically absent. Regardless, there are no inflammatory changes adjacent to the cecum to suggest presence of an acute appendicitis at this time. Vascular/Lymphatic: Minimal atherosclerotic  disease is noted throughout the abdominal and pelvic vasculature, without evidence of aneurysm or dissection. No lymphadenopathy is noted in the abdomen or pelvis. Reproductive: Uterus is slightly heterogeneous in appearance, likely to reflect the presence of multiple small fibroids, largest of which is in the right side of the uterine body measuring up to 3.8 cm. Ovaries are unremarkable in appearance. Other: No significant volume of ascites.  No pneumoperitoneum. Musculoskeletal: There are no aggressive appearing lytic or blastic lesions noted in the visualized portions of the skeleton. IMPRESSION: 1. Mural thickening of small bowel involving portions of the duodenum and proximal jejunum, as above, suggestive of enteritis. 2. No findings to suggest small bowel obstruction at this time. 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 No findings to suggest frank midgut volvulus. 3. Cholelithiasis without evidence of acute cholecystitis at this time. 4. Fibroid uterus. 5. Additional incidental findings, as above. Electronically Signed   By: Mauri Brooklyn.D.  On: 11/01/2015 23:38    EKG: Independently reviewed.  Assessment/Plan Active Problems:   Enteritis   1. Enteritis - 1. Stool cultures (although no diarrhea) 2. Empiric treatment with cipro / flagyl 3. Pain ctrl with morphine 4. Nausea ctrl with zofran    Code Status: Full  Family Communication: No family in room Disposition Plan: Admit to obs   Time spent: 50 min  GARDNER, JARED M. [facility] Pager [phone]  If 7AM-7PM, please contact the day team taking care of the patient [email or web address] Password [email or web address] 11/02/2015, 1:12 AM

## 2015-11-03 DIAGNOSIS — I10 Essential (primary) hypertension: Secondary | ICD-10-CM

## 2015-11-03 LAB — CBC
HCT: 34 % — ABNORMAL LOW (ref 36.0–46.0)
Hemoglobin: 10.2 g/dL — ABNORMAL LOW (ref 12.0–15.0)
MCH: 21.5 pg — ABNORMAL LOW (ref 26.0–34.0)
MCHC: 30 g/dL (ref 30.0–36.0)
MCV: 71.6 fL — ABNORMAL LOW (ref 78.0–100.0)
Platelets: 110 10*3/uL — ABNORMAL LOW (ref 150–400)
RBC: 4.75 MIL/uL (ref 3.87–5.11)
RDW: 15.5 % (ref 11.5–15.5)
WBC: 3.5 10*3/uL — ABNORMAL LOW (ref 4.0–10.5)

## 2015-11-03 LAB — BASIC METABOLIC PANEL
Anion gap: 7 (ref 5–15)
BUN: 6 mg/dL (ref 6–20)
CO2: 24 mmol/L (ref 22–32)
Calcium: 8.6 mg/dL — ABNORMAL LOW (ref 8.9–10.3)
Chloride: 107 mmol/L (ref 101–111)
Creatinine, Ser: 0.6 mg/dL (ref 0.44–1.00)
GFR calc Af Amer: >60 mL/min (ref >60)
GFR calc non Af Amer: >60 mL/min (ref >60)
Glucose, Bld: 120 mg/dL — ABNORMAL HIGH (ref 65–99)
Potassium: 3.5 mmol/L (ref 3.5–5.1)
Sodium: 138 mmol/L (ref 135–145)

## 2015-11-03 MED ORDER — LISINOPRIL 10 MG PO TABS
10.0000 mg | ORAL_TABLET | Freq: Every day | ORAL | Status: DC
Start: 2015-11-03 — End: 2015-11-04
  Administered 2015-11-03 – 2015-11-04 (×2): 10 mg via ORAL
  Filled 2015-11-03 (×2): qty 1

## 2015-11-03 NOTE — Progress Notes (Signed)
[facility]                                                                              Patient Demographics  Christina Kirby, is a 50 y.o. female, DOB - May 29, 1966, [id number]  Admit date - 11/01/2015   Admitting Physician Etta Quill, DO  Outpatient Primary MD for the patient is No primary care provider on file.  LOS - 1   Chief Complaint  Patient presents with  . Abdominal Pain      HPI on 11/02/2015 by Dr. Jennette Kettle Christina Kirby is a 50 y.o. female who presents to the ED with 4 day history of abdominal pain. Pain initially epigastric. It has gotten worse and now spread out to central abdomen. No V/D, patient presents to ED for worsening symptoms.  Assessment & Plan   Abdominal pain secondary to enteritis -CT abd/pelvis: Thickening of small bowel including portions of duodenum and proximal jejunum, suggestive of enteritis. No SBO. Small bowel malrotation incidentally noted. Cholelithiasis without evidence of acute cholecystitis.   -Pending stool cultures -Continue cipro/flagyl- will transition to oral form today -Continue pain control and antiemetics PRN -Spoke with general surgery, Dr. Marlou Starks.  No intervention needed for malrotation. Continue current treatment.  -Tolerated full liquid diet.  Will advance to soft.  Chronic microcytic anemia -Baseline on hemoglobin 11 -Hemoglobin currently 10.2, continue to monitor CBC  Chronic thrombocytopenia -It was currently 110, continue monitor CBC -Patient will need outpatient follow-up and monitoring.  Uterine fibroids -Seen on CT abdomen and pelvis -Will need outpatient follow-up with gynecology  Bacterial vaginosis -Wet prep showed many clue cells -Patient currently on Flagyl  Hypertension -Possibly related to pain vs long standing -Will start on low dose lisinopril  Code Status: Full  Family Communication: Friend at bedside  Disposition Plan: Admitted  Time Spent in minutes   30  minutes  Procedures  None  Consults   General surgery, via phone  DVT Prophylaxis  lovenox  Lab Results  Component Value Date   PLT 110* 11/03/2015    Medications  Scheduled Meds: . ciprofloxacin  400 mg Intravenous Q12H  . enoxaparin (LOVENOX) injection  40 mg Subcutaneous Q24H  . lisinopril  10 mg Oral Daily  . metronidazole  500 mg Intravenous Q8H   Continuous Infusions:   PRN Meds:.morphine injection, ondansetron **OR** ondansetron (ZOFRAN) IV  Antibiotics    Anti-infectives    Start     Dose/Rate Route Frequency Ordered Stop   11/02/15 0115  ciprofloxacin (CIPRO) IVPB 400 mg     400 mg 200 mL/hr over 60 Minutes Intravenous Every 12 hours 11/02/15 0102     11/02/15 0115  metroNIDAZOLE (FLAGYL) IVPB 500 mg     500 mg 100 mL/hr over 60 Minutes Intravenous Every 8 hours 11/02/15 0102        Subjective:   Christina Kirby seen and examined today.  Patient denies any chest pain, shortness of breath,  nausea or vomiting, diarrhea constipation. Feels her abdominal pain has improved, but did need pain medicine overnight. Has not had a bowel movement since admission.   Objective:   Filed Vitals:   11/02/15 [id number] 11/02/15 1541 11/02/15 2359 11/03/15 [id number]  BP: 130/86 149/94 132/91 154/90  Pulse: 80 83 74 86  Temp: 98.1 F (36.7 C) 98 F (36.7 C) 98.5 F (36.9 C) 98.3 F (36.8 C)  TempSrc: Oral Oral Oral Oral  Resp: 20 18 18 18   Height:      Weight:      SpO2: 100% 100% 98% 95%    Wt Readings from Last 3 Encounters:  11/02/15 81.421 kg (179 lb 8 oz)  09/03/15 77.111 kg (170 lb)     Intake/Output Summary (Last 24 hours) at 11/03/15 1012 Last data filed at 11/03/15 0600  Gross per 24 hour  Intake   1980 ml  Output      0 ml  Net   1980 ml    Exam  General: Well developed, well nourished, NAD  HEENT: NCAT, mucous membranes moist.   Cardiovascular: S1 S2 auscultated, RRR, no murmurs  Respiratory: Clear to auscultation bilaterally   Abdomen:  Soft, Mild RLQ TTP, nondistended, + bowel sounds  Extremities: warm dry without cyanosis clubbing or edema  Neuro: AAOx3, Nonfocal  Psych: Normal affect and demeanor  Data Review   Micro Results Recent Results (from the past 240 hour(s))  Wet prep, genital     Status: Abnormal   Collection Time: 11/01/15  9:41 PM  Result Value Ref Range Status   Yeast Wet Prep HPF POC NONE SEEN NONE SEEN Final   Trich, Wet Prep NONE SEEN NONE SEEN Final   Clue Cells Wet Prep HPF POC MANY (A) NONE SEEN Final   WBC, Wet Prep HPF POC FEW (A) NONE SEEN Final   Sperm NONE SEEN  Final    Radiology Reports Ct Abdomen Pelvis W Contrast  11/01/2015  CLINICAL DATA:  50 year old female with generalized abdominal pain. No current nausea, vomiting or diarrhea. EXAM: CT ABDOMEN AND PELVIS WITH CONTRAST TECHNIQUE: Multidetector CT imaging of the abdomen and pelvis was performed using the standard protocol following bolus administration of intravenous contrast. CONTRAST:  181mL OMNIPAQUE IOHEXOL 300 MG/ML  SOLN COMPARISON:  No priors. FINDINGS: Lower chest:  Unremarkable. Hepatobiliary: No cystic or solid hepatic lesions. No intra or extrahepatic biliary ductal dilatation. Calcified gallstones in the gallbladder. No current findings to suggest an acute cholecystitis at this time. Pancreas: No pancreatic mass. No pancreatic ductal dilatation. No pancreatic or peripancreatic fluid or inflammatory changes. Spleen: Unremarkable. Adrenals/Urinary Tract: 10 mm simple cyst in the upper pole of the right kidney. Left kidney and bilateral adrenal glands are normal in appearance. No hydroureteronephrosis. Urinary bladder is normal in appearance. Stomach/Bowel: Normal appearance of the stomach. No pathologic dilatation of small bowel or colon. There are several loops of small bowel including the third and fourth portions of the duodenum, and proximal aspects of the jejunum which appear thickened, best appreciated in the right side of  the abdomen on image 45 of series 2. Although the duodenum crosses the midline beneath the superior mesenteric artery, the third/fourth portion of the duodenum do not extend upward and the duodenal jejunal junction lie is well below the level of the pylorus. 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 Colon is normally located. Appendix is not confidently identified, likely surgically absent. Regardless, there are no inflammatory changes adjacent to the cecum to suggest presence of an acute appendicitis at this time. Vascular/Lymphatic: Minimal atherosclerotic disease is noted throughout the abdominal and pelvic vasculature, without evidence of aneurysm or dissection. No lymphadenopathy is  noted in the abdomen or pelvis. Reproductive: Uterus is slightly heterogeneous in appearance, likely to reflect the presence of multiple small fibroids, largest of which is in the right side of the uterine body measuring up to 3.8 cm. Ovaries are unremarkable in appearance. Other: No significant volume of ascites.  No pneumoperitoneum. Musculoskeletal: There are no aggressive appearing lytic or blastic lesions noted in the visualized portions of the skeleton. IMPRESSION: 1. Mural thickening of small bowel involving portions of the duodenum and proximal jejunum, as above, suggestive of enteritis. 2. No findings to suggest small bowel obstruction at this time. 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 No findings to suggest frank midgut volvulus. 3. Cholelithiasis without evidence of acute cholecystitis at this time. 4. Fibroid uterus. 5. Additional incidental findings, as above. Electronically Signed   By: Vinnie Langton M.D.   On: 11/01/2015 23:38    CBC  Recent Labs Lab 10/30/15 1940 11/01/15 1551 11/01/15 2105 11/03/15 0631  WBC 4.6 4.5 3.0* 3.5*  HGB 10.3* 11.0* 12.4  10.2*  HCT 33.2* 36.0 40.9 34.0*  PLT 95* 105* 83* 110*  MCV 69.9* 71.3* 71.4* 71.6*  MCH 21.7* 21.8* 21.6* 21.5*  MCHC 31.0 30.6 30.3 30.0  RDW 15.9* 15.7* 15.8* 15.5  LYMPHSABS  --   --  1.0  --   MONOABS  --   --  0.2  --   EOSABS  --   --  0.1  --   BASOSABS  --   --  0.0  --     Chemistries   Recent Labs Lab 10/30/15 1940 11/01/15 1551 11/03/15 0631  NA 139 140 138  K 3.9 3.6 3.5  CL 108 107 107  CO2 28 26 24   GLUCOSE 134* 204* 120*  BUN 10 11 6   CREATININE 0.63 0.66 0.60  CALCIUM 8.8* 9.3 8.6*  AST 23 20  --   ALT 21 20  --   ALKPHOS 72 72  --   BILITOT 0.6 0.3  --    ------------------------------------------------------------------------------------------------------------------ estimated creatinine clearance is 85.9 mL/min (by C-G formula based on Cr of 0.6). ------------------------------------------------------------------------------------------------------------------ No results for input(s): HGBA1C in the last 72 hours. ------------------------------------------------------------------------------------------------------------------ No results for input(s): CHOL, HDL, LDLCALC, TRIG, CHOLHDL, LDLDIRECT in the last 72 hours. ------------------------------------------------------------------------------------------------------------------ No results for input(s): TSH, T4TOTAL, T3FREE, THYROIDAB in the last 72 hours.  Invalid input(s): FREET3 ------------------------------------------------------------------------------------------------------------------ No results for input(s): VITAMINB12, FOLATE, FERRITIN, TIBC, IRON, RETICCTPCT in the last 72 hours.  Coagulation profile No results for input(s): INR, PROTIME in the last 168 hours.  No results for input(s): DDIMER in the last 72 hours.  Cardiac Enzymes No results for input(s): CKMB, TROPONINI, MYOGLOBIN in the last 168 hours.  Invalid input(s):  CK ------------------------------------------------------------------------------------------------------------------ Invalid input(s): POCBNP    Christina Kirby D.O. on 11/03/2015 at 10:12 AM  Between 7am to 7pm - Pager - [phone]  After 7pm go to [email or web address] - password [email or web address]  And look for the night coverage person covering for me after hours  [facility] Office  [phone]

## 2015-11-04 LAB — GASTROINTESTINAL PANEL BY PCR, STOOL (REPLACES STOOL CULTURE)

## 2015-11-04 LAB — CBC
HCT: 33.2 % — ABNORMAL LOW (ref 36.0–46.0)
Hemoglobin: 10.2 g/dL — ABNORMAL LOW (ref 12.0–15.0)
MCH: 21.7 pg — ABNORMAL LOW (ref 26.0–34.0)
MCHC: 30.7 g/dL (ref 30.0–36.0)
MCV: 70.8 fL — ABNORMAL LOW (ref 78.0–100.0)
Platelets: 111 10*3/uL — ABNORMAL LOW (ref 150–400)
RBC: 4.69 MIL/uL (ref 3.87–5.11)
RDW: 15.5 % (ref 11.5–15.5)
WBC: 3.7 10*3/uL — ABNORMAL LOW (ref 4.0–10.5)

## 2015-11-04 LAB — BASIC METABOLIC PANEL
Anion gap: 5 (ref 5–15)
BUN: <5 mg/dL — ABNORMAL LOW (ref 6–20)
CO2: 25 mmol/L (ref 22–32)
Calcium: 8.7 mg/dL — ABNORMAL LOW (ref 8.9–10.3)
Chloride: 107 mmol/L (ref 101–111)
Creatinine, Ser: 0.59 mg/dL (ref 0.44–1.00)
GFR calc Af Amer: >60 mL/min (ref >60)
GFR calc non Af Amer: >60 mL/min (ref >60)
Glucose, Bld: 142 mg/dL — ABNORMAL HIGH (ref 65–99)
Potassium: 3.5 mmol/L (ref 3.5–5.1)
Sodium: 137 mmol/L (ref 135–145)

## 2015-11-04 LAB — OCCULT BLOOD X 1 CARD TO LAB, STOOL: Fecal Occult Bld: NEGATIVE

## 2015-11-04 LAB — GC/CHLAMYDIA PROBE AMP (~~LOC~~) NOT AT ARMC
Chlamydia: NEGATIVE
Neisseria Gonorrhea: NEGATIVE

## 2015-11-04 MED ORDER — METRONIDAZOLE 500 MG PO TABS
500.0000 mg | ORAL_TABLET | Freq: Three times a day (TID) | ORAL | Status: DC
  Administered 2015-11-04: 500 mg via ORAL
  Filled 2015-11-04 (×3): qty 1

## 2015-11-04 MED ORDER — CIPROFLOXACIN HCL 500 MG PO TABS: 500.0000 mg | ORAL_TABLET | Freq: Two times a day (BID) | ORAL | Status: DC

## 2015-11-04 MED ORDER — CIPROFLOXACIN HCL 500 MG PO TABS
500.0000 mg | ORAL_TABLET | Freq: Two times a day (BID) | ORAL | Status: DC
  Administered 2015-11-04: 500 mg via ORAL
  Filled 2015-11-04 (×3): qty 1

## 2015-11-04 MED ORDER — METRONIDAZOLE 500 MG PO TABS: 500.0000 mg | ORAL_TABLET | Freq: Three times a day (TID) | ORAL | Status: DC

## 2015-11-04 MED ORDER — LISINOPRIL 10 MG PO TABS: 10.0000 mg | ORAL_TABLET | Freq: Every day | ORAL | Status: DC

## 2015-11-04 NOTE — Progress Notes (Signed)
Pt discharged to home via private vehicle. Discharge instruction given and all questions answered. IV taken out and site is clean, dry and intact.

## 2015-11-04 NOTE — Discharge Summary (Signed)
Physician Discharge Summary  Christina Kirby [id number] DOB: [birth year] DOA: 11/01/2015  PCP: No primary care provider on file.  Admit date: 11/01/2015 Discharge date: 11/04/2015  Time spent: 45 minutes  Recommendations for Outpatient Follow-up:  Patient will be discharged to home.  Patient will need to follow up with primary care provider within one week of discharge, repeat CBC and BMP.  Patient should continue medications as prescribed.  Patient should follow a heart healthy diet.   Discharge Diagnoses:  Abdominal pain secondary to enteritis Chronic microcytic anemia Chronic thrombocytopenia Uterine fibroids Bacterial vaginosis Hypertension  Discharge Condition: Stable  Diet recommendation: heart healthy  Filed Weights   11/01/15 1526 11/02/15 0315  Weight: 77.111 kg (170 lb) 81.421 kg (179 lb 8 oz)    History of present illness:  on 11/02/2015 by Dr. Jennette Kettle Christina Kirby is a 50 y.o. female who presents to the ED with 4 day history of abdominal pain. Pain initially epigastric. It has gotten worse and now spread out to central abdomen. No V/D, patient presents to ED for worsening symptoms.  Hospital Course:  Abdominal pain secondary to enteritis -CT abd/pelvis: Thickening of small bowel including portions of duodenum and proximal jejunum, suggestive of enteritis. No SBO. Small bowel malrotation incidentally noted. Cholelithiasis without evidence of acute cholecystitis.  -Pending stool cultures, can be followed as an outpatient -Continue cipro/flagyl -Spoke with general surgery, Dr. Marlou Starks. No intervention needed for malrotation. Continue current treatment.  -Diet advanced, patient able to tolerate  Chronic microcytic anemia -Baseline on hemoglobin 11 -Hemoglobin currently 10.2  Chronic thrombocytopenia -It was currently 111 -Patient will need outpatient follow-up and monitoring.  Uterine fibroids -Seen on CT abdomen and pelvis -Will need  outpatient follow-up with gynecology -Currently patient asymptomatic  Bacterial vaginosis -Wet prep showed many clue cells -Patient currently on Flagyl  Hypertension -Possibly related to pain vs long standing -Continue lisinopril -Follow up with PCP and have renal function monitored  Procedures:  None  Consultations:  None  Discharge Exam: Filed Vitals:   11/03/15 2108 11/04/15 0600  BP: 154/93 135/92  Pulse: 81   Temp: 98 F (36.7 C) 98 F (36.7 C)  Resp: 18 16    Exam  General: Well developed, well nourished, NAD  HEENT: NCAT, mucous membranes moist.   Cardiovascular: S1 S2 auscultated, RRR, no murmurs  Respiratory: Clear to auscultation bilaterally  Abdomen: Soft, nontender, nondistended, + bowel sounds  Extremities: warm dry without cyanosis clubbing or edema  Neuro: AAOx3, Nonfocal  Psych: Normal affect and demeanor, pleasant  Discharge Instructions      Discharge Instructions    Discharge instructions    Complete by:  As directed   Patient will be discharged to home.  Patient will need to follow up with primary care provider within one week of discharge, repeat CBC and BMP.  Patient should continue medications as prescribed.  Patient should follow a heart healthy diet.            Medication List    TAKE these medications        ciprofloxacin 500 MG tablet  Commonly known as:  CIPRO  Take 1 tablet (500 mg total) by mouth 2 (two) times daily.     diphenhydramine-acetaminophen 25-500 MG Tabs tablet  Commonly known as:  TYLENOL PM  Take 2 tablets by mouth at bedtime as needed (pain).     famotidine 20 MG tablet  Commonly known as:  PEPCID  Take 1 tablet (20 mg total) by mouth 2 (two)  times daily.     lisinopril 10 MG tablet  Commonly known as:  PRINIVIL,ZESTRIL  Take 1 tablet (10 mg total) by mouth daily.     metroNIDAZOLE 500 MG tablet  Commonly known as:  FLAGYL  Take 1 tablet (500 mg total) by mouth every 8 (eight) hours.         Allergies  Allergen Reactions  . Sulfa Antibiotics Hives      The results of significant diagnostics from this hospitalization (including imaging, microbiology, ancillary and laboratory) are listed below for reference.    Significant Diagnostic Studies: Ct Abdomen Pelvis W Contrast  11/01/2015  CLINICAL DATA:  50 year old female with generalized abdominal pain. No current nausea, vomiting or diarrhea. EXAM: CT ABDOMEN AND PELVIS WITH CONTRAST TECHNIQUE: Multidetector CT imaging of the abdomen and pelvis was performed using the standard protocol following bolus administration of intravenous contrast. CONTRAST:  152mL OMNIPAQUE IOHEXOL 300 MG/ML  SOLN COMPARISON:  No priors. FINDINGS: Lower chest:  Unremarkable. Hepatobiliary: No cystic or solid hepatic lesions. No intra or extrahepatic biliary ductal dilatation. Calcified gallstones in the gallbladder. No current findings to suggest an acute cholecystitis at this time. Pancreas: No pancreatic mass. No pancreatic ductal dilatation. No pancreatic or peripancreatic fluid or inflammatory changes. Spleen: Unremarkable. Adrenals/Urinary Tract: 10 mm simple cyst in the upper pole of the right kidney. Left kidney and bilateral adrenal glands are normal in appearance. No hydroureteronephrosis. Urinary bladder is normal in appearance. Stomach/Bowel: Normal appearance of the stomach. No pathologic dilatation of small bowel or colon. There are several loops of small bowel including the third and fourth portions of the duodenum, and proximal aspects of the jejunum which appear thickened, best appreciated in the right side of the abdomen on image 45 of series 2. Although the duodenum crosses the midline beneath the superior mesenteric artery, the third/fourth portion of the duodenum do not extend upward and the duodenal jejunal junction lie is well below the level of the pylorus. Additionally, the jejunum appears predominantly clustered in the right-sided the  abdomen adjacent to the cecum; imaging findings compatible with small bowel malrotation. Colon is normally located. Appendix is not confidently identified, likely surgically absent. Regardless, there are no inflammatory changes adjacent to the cecum to suggest presence of an acute appendicitis at this time. Vascular/Lymphatic: Minimal atherosclerotic disease is noted throughout the abdominal and pelvic vasculature, without evidence of aneurysm or dissection. No lymphadenopathy is noted in the abdomen or pelvis. Reproductive: Uterus is slightly heterogeneous in appearance, likely to reflect the presence of multiple small fibroids, largest of which is in the right side of the uterine body measuring up to 3.8 cm. Ovaries are unremarkable in appearance. Other: No significant volume of ascites.  No pneumoperitoneum. Musculoskeletal: There are no aggressive appearing lytic or blastic lesions noted in the visualized portions of the skeleton. IMPRESSION: 1. Mural thickening of small bowel involving portions of the duodenum and proximal jejunum, as above, suggestive of enteritis. 2. No findings to suggest small bowel obstruction at this time. 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 No findings to suggest frank midgut volvulus. 3. Cholelithiasis without evidence of acute cholecystitis at this time. 4. Fibroid uterus. 5. Additional incidental findings, as above. Electronically Signed   By: Vinnie Langton M.D.   On: 11/01/2015 23:38    Microbiology: Recent Results (from the past 240 hour(s))  Wet prep, genital     Status: Abnormal   Collection Time: 11/01/15  9:41 PM  Result Value Ref Range Status  Yeast Wet Prep HPF POC NONE SEEN NONE SEEN Final   Trich, Wet Prep NONE SEEN NONE SEEN Final   Clue Cells Wet Prep HPF POC MANY (A) NONE SEEN Final   WBC, Wet Prep HPF POC FEW (A) NONE SEEN Final   Sperm NONE SEEN  Final     Labs: Basic Metabolic Panel:  Recent Labs Lab 10/30/15 1940  11/01/15 1551 11/03/15 0631 11/04/15 0525  NA 139 140 138 137  K 3.9 3.6 3.5 3.5  CL 108 107 107 107  CO2 28 26 24 25   GLUCOSE 134* 204* 120* 142*  BUN 10 11 6  <5*  CREATININE 0.63 0.66 0.60 0.59  CALCIUM 8.8* 9.3 8.6* 8.7*   Liver Function Tests:  Recent Labs Lab 10/30/15 1940 11/01/15 1551  AST 23 20  ALT 21 20  ALKPHOS 72 72  BILITOT 0.6 0.3  PROT 7.4 7.8  ALBUMIN 3.9 4.1    Recent Labs Lab 10/30/15 1940 11/01/15 1551  LIPASE 39 43  AMYLASE 116*  --    No results for input(s): AMMONIA in the last 168 hours. CBC:  Recent Labs Lab 10/30/15 1940 11/01/15 1551 11/01/15 2105 11/03/15 0631 11/04/15 0525  WBC 4.6 4.5 3.0* 3.5* 3.7*  NEUTROABS  --   --  1.7  --   --   HGB 10.3* 11.0* 12.4 10.2* 10.2*  HCT 33.2* 36.0 40.9 34.0* 33.2*  MCV 69.9* 71.3* 71.4* 71.6* 70.8*  PLT 95* 105* 83* 110* 111*   Cardiac Enzymes: No results for input(s): CKTOTAL, CKMB, CKMBINDEX, TROPONINI in the last 168 hours. BNP: BNP (last 3 results) No results for input(s): BNP in the last 8760 hours.  ProBNP (last 3 results) No results for input(s): PROBNP in the last 8760 hours.  CBG: No results for input(s): GLUCAP in the last 168 hours.     SignedCristal Ford  [facility] 11/04/2015, 10:01 AM

## 2015-11-04 NOTE — Progress Notes (Signed)
Spoke with pt and sister at bedside concerning discharge orders.  Pt just moved here, work prn. Explained [place] program and pt's medications are on the $4 list at [facility].  Appointment at [facility] for 11/06/15 at 3:30 PM.  Pt agreed with appointment.  Pt can also purchase medications at [facility].

## 2015-11-04 NOTE — Discharge Instructions (Signed)

## 2015-11-06 ENCOUNTER — Encounter: Payer: Self-pay | Admitting: Internal Medicine

## 2015-11-06 ENCOUNTER — Ambulatory Visit: Payer: Self-pay | Attending: Internal Medicine | Admitting: Internal Medicine

## 2015-11-06 VITALS — BP 135/81 | HR 87 | Temp 97.4°F | Resp 18 | Ht 63.0 in | Wt 183.0 lb

## 2015-11-06 DIAGNOSIS — K529 Noninfective gastroenteritis and colitis, unspecified: Secondary | ICD-10-CM | POA: Insufficient documentation

## 2015-11-06 DIAGNOSIS — D649 Anemia, unspecified: Secondary | ICD-10-CM | POA: Insufficient documentation

## 2015-11-06 DIAGNOSIS — D569 Thalassemia, unspecified: Secondary | ICD-10-CM | POA: Insufficient documentation

## 2015-11-06 DIAGNOSIS — D259 Leiomyoma of uterus, unspecified: Secondary | ICD-10-CM | POA: Insufficient documentation

## 2015-11-06 DIAGNOSIS — I10 Essential (primary) hypertension: Secondary | ICD-10-CM | POA: Insufficient documentation

## 2015-11-06 NOTE — Progress Notes (Signed)
Subjective:    Patient ID: [name], female    DOB: 1966/03/04, 50 y.o.   MRN: [id number]  HPI  Patient here for ED follow up - eval/tx for abdominal pain related to enteritis - symptoms improved with antibiotics (day 4/7 now) - No recurrence of pain or GI symptoms since DC  Started on ACEI - no side effects but unsure if she should continue same after 30d - known HTN prior to moving here 3 mo ago but not prev on rx  Past Medical History  Diagnosis Date  . Thalassemia   . Hypertension     Review of Systems  Constitutional: Negative for fever, fatigue and unexpected weight change.  Respiratory: Negative for cough and shortness of breath.   Cardiovascular: Negative for chest pain and leg swelling.  Gastrointestinal: Negative for nausea, vomiting, abdominal pain and diarrhea.  Neurological: Negative for light-headedness and headaches.  Psychiatric/Behavioral: Negative for behavioral problems and dysphoric mood.       Objective:    Physical Exam  Constitutional: She appears well-developed and well-nourished. No distress.  Overweight, very pleasant  Cardiovascular: Normal rate, regular rhythm and normal heart sounds.   No murmur heard. Pulmonary/Chest: Effort normal and breath sounds normal. No respiratory distress.  Abdominal: Soft. Bowel sounds are normal. She exhibits no distension. There is no tenderness. There is no rebound and no guarding.  Musculoskeletal: She exhibits no edema.    BP 135/81 mmHg  Pulse 87  Temp(Src) 97.4 F (36.3 C) (Oral)  Resp 18  Ht 5\' 3"  (1.6 m)  Wt 183 lb (83.008 kg)  BMI 32.43 kg/m2  SpO2 100%  LMP  (LMP Unknown) Wt Readings from Last 3 Encounters:  11/06/15 183 lb (83.008 kg)  11/02/15 179 lb 8 oz (81.421 kg)  09/03/15 170 lb (77.111 kg)     Lab Results  Component Value Date   WBC 3.7* 11/04/2015   HGB 10.2* 11/04/2015   HCT 33.2* 11/04/2015   PLT 111* 11/04/2015   GLUCOSE 142* 11/04/2015   ALT 20 11/01/2015   AST  20 11/01/2015   NA 137 11/04/2015   K 3.5 11/04/2015   CL 107 11/04/2015   CREATININE 0.59 11/04/2015   BUN <5* 11/04/2015   CO2 25 11/04/2015    Ct Abdomen Pelvis W Contrast  11/01/2015  CLINICAL DATA:  50 year old female with generalized abdominal pain. No current nausea, vomiting or diarrhea. EXAM: CT ABDOMEN AND PELVIS WITH CONTRAST TECHNIQUE: Multidetector CT imaging of the abdomen and pelvis was performed using the standard protocol following bolus administration of intravenous contrast. CONTRAST:  163mL OMNIPAQUE IOHEXOL 300 MG/ML  SOLN COMPARISON:  No priors. FINDINGS: Lower chest:  Unremarkable. Hepatobiliary: No cystic or solid hepatic lesions. No intra or extrahepatic biliary ductal dilatation. Calcified gallstones in the gallbladder. No current findings to suggest an acute cholecystitis at this time. Pancreas: No pancreatic mass. No pancreatic ductal dilatation. No pancreatic or peripancreatic fluid or inflammatory changes. Spleen: Unremarkable. Adrenals/Urinary Tract: 10 mm simple cyst in the upper pole of the right kidney. Left kidney and bilateral adrenal glands are normal in appearance. No hydroureteronephrosis. Urinary bladder is normal in appearance. Stomach/Bowel: Normal appearance of the stomach. No pathologic dilatation of small bowel or colon. There are several loops of small bowel including the third and fourth portions of the duodenum, and proximal aspects of the jejunum which appear thickened, best appreciated in the right side of the abdomen on image 45 of series 2. Although the duodenum crosses the midline beneath  the superior mesenteric artery, the third/fourth portion of the duodenum do not extend upward and the duodenal jejunal junction lie is well below the level of the pylorus. 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 Colon is normally located.  Appendix is not confidently identified, likely surgically absent. Regardless, there are no inflammatory changes adjacent to the cecum to suggest presence of an acute appendicitis at this time. Vascular/Lymphatic: Minimal atherosclerotic disease is noted throughout the abdominal and pelvic vasculature, without evidence of aneurysm or dissection. No lymphadenopathy is noted in the abdomen or pelvis. Reproductive: Uterus is slightly heterogeneous in appearance, likely to reflect the presence of multiple small fibroids, largest of which is in the right side of the uterine body measuring up to 3.8 cm. Ovaries are unremarkable in appearance. Other: No significant volume of ascites.  No pneumoperitoneum. Musculoskeletal: There are no aggressive appearing lytic or blastic lesions noted in the visualized portions of the skeleton. IMPRESSION: 1. Mural thickening of small bowel involving portions of the duodenum and proximal jejunum, as above, suggestive of enteritis. 2. No findings to suggest small bowel obstruction at this time. 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 No findings to suggest frank midgut volvulus. 3. Cholelithiasis without evidence of acute cholecystitis at this time. 4. Fibroid uterus. 5. Additional incidental findings, as above. Electronically Signed   By: Vinnie Langton M.D.   On: 11/01/2015 23:38       Assessment & Plan:   Enteritis - symptoms resolve - complete course of abx as rx'd - no further I witnessed patient signing consent to Medical Procedure and Treatment form. Planned  Fibroids - no meses >3 years - no need for urgent gyn follow up   Anemia - long hx thalassemia per pt - no further workup needed at present  Problem List Items Addressed This Visit    RESOLVED: Enteritis   Essential hypertension - Primary    BP Readings from Last 3 Encounters:  11/06/15 135/81  11/04/15 130/90  10/30/15 156/84   Continue ACEI as begun during hospitalization  10/2015 follow up next 30d to refill or DC if "not needed" - pt will keep BP log to review at follow up and call if BP uncontrolled or too low/symptomatic  between now and then      Fibroid, uterine   Thalassemia       Christina Kirby Grant, MD

## 2015-11-06 NOTE — Patient Instructions (Addendum)
It was good to see you today.  We have reviewed your prior records including labs and tests today  Medications reviewed and updated remian on lisinopril until further notice to control your blood pressure  Complete antibiotics as prescribed - no additional duration planned at this time No other medication changes recommended at this time.  Please schedule followup in 4 weeks to recheck blood pressure, review home BP log and check labs - please call sooner if problems.  Hypertension Hypertension, commonly called high blood pressure, is when the force of blood pumping through your arteries is too strong. Your arteries are the blood vessels that carry blood from your heart throughout your body. A blood pressure reading consists of a higher number over a lower number, such as 110/72. The higher number (systolic) is the pressure inside your arteries when your heart pumps. The lower number (diastolic) is the pressure inside your arteries when your heart relaxes. Ideally you want your blood pressure below 120/80. Hypertension forces your heart to work harder to pump blood. Your arteries may become narrow or stiff. Having untreated or uncontrolled hypertension can cause heart attack, stroke, kidney disease, and other problems. RISK FACTORS Some risk factors for high blood pressure are controllable. Others are not.  Risk factors you cannot control include:   Race. You may be at higher risk if you are African American.  Age. Risk increases with age.  Gender. Men are at higher risk than women before age 76 years. After age 41, women are at higher risk than men. Risk factors you can control include:  Not getting enough exercise or physical activity.  Being overweight.  Getting too much fat, sugar, calories, or salt in your diet.  Drinking too much alcohol. SIGNS AND SYMPTOMS Hypertension does not usually cause signs or symptoms. Extremely high blood pressure (hypertensive crisis) may cause  headache, anxiety, shortness of breath, and nosebleed. DIAGNOSIS To check if you have hypertension, your health care provider will measure your blood pressure while you are seated, with your arm held at the level of your heart. It should be measured at least twice using the same arm. Certain conditions can cause a difference in blood pressure between your right and left arms. A blood pressure reading that is higher than normal on one occasion does not mean that you need treatment. If it is not clear whether you have high blood pressure, you may be asked to return on a different day to have your blood pressure checked again. Or, you may be asked to monitor your blood pressure at home for 1 or more weeks. TREATMENT Treating high blood pressure includes making lifestyle changes and possibly taking medicine. Living a healthy lifestyle can help lower high blood pressure. You may need to change some of your habits. Lifestyle changes may include:  Following the DASH diet. This diet is high in fruits, vegetables, and whole grains. It is low in salt, red meat, and added sugars.  Keep your sodium intake below 2,300 mg per day.  Getting at least 30-45 minutes of aerobic exercise at least 4 times per week.  Losing weight if necessary.  Not smoking.  Limiting alcoholic beverages.  Learning ways to reduce stress. Your health care provider may prescribe medicine if lifestyle changes are not enough to get your blood pressure under control, and if one of the following is true:  You are 60-50 years of age and your systolic blood pressure is above 140.  You are 1 years of age or  older, and your systolic blood pressure is above 150.  Your diastolic blood pressure is above 90.  You have diabetes, and your systolic blood pressure is over [id number] or your diastolic blood pressure is over 90.  You have kidney disease and your blood pressure is above 140/90.  You have heart disease and your blood pressure is  above 140/90. Your personal target blood pressure may vary depending on your medical conditions, your age, and other factors. HOME CARE INSTRUCTIONS  Have your blood pressure rechecked as directed by your health care provider.   Take medicines only as directed by your health care provider. Follow the directions carefully. Blood pressure medicines must be taken as prescribed. The medicine does not work as well when you skip doses. Skipping doses also puts you at risk for problems.  Do not smoke.   Monitor your blood pressure at home as directed by your health care provider. SEEK MEDICAL CARE IF:   You think you are having a reaction to medicines taken.  You have recurrent headaches or feel dizzy.  You have swelling in your ankles.  You have trouble with your vision. SEEK IMMEDIATE MEDICAL CARE IF:  You develop a severe headache or confusion.  You have unusual weakness, numbness, or feel faint.  You have severe chest or abdominal pain.  You vomit repeatedly.  You have trouble breathing. MAKE SURE YOU:   Understand these instructions.  Will watch your condition.  Will get help right away if you are not doing well or get worse.   This information is not intended to replace advice given to you by your health care provider. Make sure you discuss any questions you have with your health care provider.   Document Released: 08/10/2005 Document Revised: 12/25/2014 Document Reviewed: 06/02/2013 Elsevier Interactive Patient Education [employer].

## 2015-11-06 NOTE — Progress Notes (Signed)
Patient is here for ED FU for Abdominal Pain  Patient denies any pain or discomfort at this time.

## 2015-11-06 NOTE — Assessment & Plan Note (Signed)
BP Readings from Last 3 Encounters:  11/06/15 135/81  11/04/15 130/90  10/30/15 156/84   Continue ACEI as begun during hospitalization 10/2015 follow up next 30d to refill or DC if "not needed" - pt will keep BP log to review at follow up and call if BP uncontrolled or too low/symptomatic  between now and then

## 2016-04-14 ENCOUNTER — Encounter: Payer: Self-pay | Admitting: Internal Medicine

## 2016-04-14 ENCOUNTER — Encounter: Payer: Self-pay | Admitting: Gastroenterology

## 2016-04-14 ENCOUNTER — Ambulatory Visit: Payer: Self-pay | Attending: Internal Medicine | Admitting: Internal Medicine

## 2016-04-14 VITALS — BP 135/96 | HR 77 | Temp 98.2°F | Resp 16 | Wt 178.4 lb

## 2016-04-14 DIAGNOSIS — Z1211 Encounter for screening for malignant neoplasm of colon: Secondary | ICD-10-CM

## 2016-04-14 DIAGNOSIS — Z888 Allergy status to other drugs, medicaments and biological substances status: Secondary | ICD-10-CM | POA: Insufficient documentation

## 2016-04-14 DIAGNOSIS — I10 Essential (primary) hypertension: Secondary | ICD-10-CM | POA: Insufficient documentation

## 2016-04-14 DIAGNOSIS — Z1321 Encounter for screening for nutritional disorder: Secondary | ICD-10-CM

## 2016-04-14 DIAGNOSIS — D569 Thalassemia, unspecified: Secondary | ICD-10-CM | POA: Insufficient documentation

## 2016-04-14 DIAGNOSIS — R51 Headache: Secondary | ICD-10-CM | POA: Insufficient documentation

## 2016-04-14 DIAGNOSIS — H6122 Impacted cerumen, left ear: Secondary | ICD-10-CM

## 2016-04-14 DIAGNOSIS — Z23 Encounter for immunization: Secondary | ICD-10-CM | POA: Insufficient documentation

## 2016-04-14 DIAGNOSIS — D649 Anemia, unspecified: Secondary | ICD-10-CM

## 2016-04-14 DIAGNOSIS — Z1239 Encounter for other screening for malignant neoplasm of breast: Secondary | ICD-10-CM

## 2016-04-14 DIAGNOSIS — Z131 Encounter for screening for diabetes mellitus: Secondary | ICD-10-CM | POA: Insufficient documentation

## 2016-04-14 LAB — POCT GLYCOSYLATED HEMOGLOBIN (HGB A1C): Hemoglobin A1C: 6

## 2016-04-14 NOTE — Progress Notes (Signed)
Pt is in the office today for establish care and headaches Pt states she is not having any pain today

## 2016-04-14 NOTE — Patient Instructions (Addendum)
Hypertension Hypertension is another name for high blood pressure. High blood pressure forces your heart to work harder to pump blood. A blood pressure reading has two numbers, which includes a higher number over a lower number (example: 110/72). HOME CARE   Have your blood pressure rechecked by your doctor.  Only take medicine as told by your doctor. Follow the directions carefully. The medicine does not work as well if you skip doses. Skipping doses also puts you at risk for problems.  Do not smoke.  Monitor your blood pressure at home as told by your doctor. GET HELP IF:  You think you are having a reaction to the medicine you are taking.  You have repeat headaches or feel dizzy.  You have puffiness (swelling) in your ankles.  You have trouble with your vision. GET HELP RIGHT AWAY IF:   You get a very bad headache and are confused.  You feel weak, numb, or faint.  You get chest or belly (abdominal) pain.  You throw up (vomit).  You cannot breathe very well. MAKE SURE YOU:   Understand these instructions.  Will watch your condition.  Will get help right away if you are not doing well or get worse.   This information is not intended to replace advice given to you by your health care provider. Make sure you discuss any questions you have with your health care provider.   Document Released: 01/27/2008 Document Revised: 08/15/2013 Document Reviewed: 06/02/2013 Elsevier Interactive Patient Education 2016 [place] DASH stands for "Dietary Approaches to Stop Hypertension." The DASH eating plan is a healthy eating plan that has been shown to reduce high blood pressure (hypertension). Additional health benefits may include reducing the risk of type 2 diabetes mellitus, heart disease, and stroke. The DASH eating plan may also help with weight loss. WHAT DO I NEED TO KNOW ABOUT THE DASH EATING PLAN? For the DASH eating plan, you will follow these  general guidelines:  Choose foods with a percent daily value for sodium of less than 5% (as listed on the food label).  Use salt-free seasonings or herbs instead of table salt or sea salt.  Check with your health care provider or pharmacist before using salt substitutes.  Eat lower-sodium products, often labeled as "lower sodium" or "no salt added."  Eat fresh foods.  Eat more vegetables, fruits, and low-fat dairy products.  Choose whole grains. Look for the word "whole" as the first word in the ingredient list.  Choose fish and skinless chicken or [place] more often than red meat. Limit fish, poultry, and meat to 6 oz (170 g) each day.  Limit sweets, desserts, sugars, and sugary drinks.  Choose heart-healthy fats.  Limit cheese to 1 oz (28 g) per day.  Eat more home-cooked food and less restaurant, buffet, and fast food.  Limit fried foods.  Cook foods using methods other than frying.  Limit canned vegetables. If you do use them, rinse them well to decrease the sodium.  When eating at a restaurant, ask that your food be prepared with less salt, or no salt if possible. WHAT FOODS CAN I EAT? Seek help from a dietitian for individual calorie needs. Grains Whole grain or whole wheat bread. Brown rice. Whole grain or whole wheat pasta. Quinoa, bulgur, and whole grain cereals. Low-sodium cereals. Corn or whole wheat flour tortillas. Whole grain cornbread. Whole grain crackers. Low-sodium crackers. Vegetables Fresh or frozen vegetables (raw, steamed, roasted, or grilled). Low-sodium  or reduced-sodium tomato and vegetable juices. Low-sodium or reduced-sodium tomato sauce and paste. Low-sodium or reduced-sodium canned vegetables.  Fruits All fresh, canned (in natural juice), or frozen fruits. Meat and Other Protein Products Ground beef (85% or leaner), grass-fed beef, or beef trimmed of fat. Skinless chicken or [place]. Ground chicken or [place]. Pork trimmed of fat. All fish and  seafood. Eggs. Dried beans, peas, or lentils. Unsalted nuts and seeds. Unsalted canned beans. Dairy Low-fat dairy products, such as skim or 1% milk, 2% or reduced-fat cheeses, low-fat ricotta or cottage cheese, or plain low-fat yogurt. Low-sodium or reduced-sodium cheeses. Fats and Oils Tub margarines without trans fats. Light or reduced-fat mayonnaise and salad dressings (reduced sodium). Avocado. Safflower, olive, or canola oils. Natural peanut or almond butter. Other Unsalted popcorn and pretzels. The items listed above may not be a complete list of recommended foods or beverages. Contact your dietitian for more options. WHAT FOODS ARE NOT RECOMMENDED? Grains White bread. White pasta. White rice. Refined cornbread. Bagels and croissants. Crackers that contain trans fat. Vegetables Creamed or fried vegetables. Vegetables in a cheese sauce. Regular canned vegetables. Regular canned tomato sauce and paste. Regular tomato and vegetable juices. Fruits Dried fruits. Canned fruit in light or heavy syrup. Fruit juice. Meat and Other Protein Products Fatty cuts of meat. Ribs, chicken wings, bacon, sausage, bologna, salami, chitterlings, fatback, hot dogs, bratwurst, and packaged luncheon meats. Salted nuts and seeds. Canned beans with salt. Dairy Whole or 2% milk, cream, half-and-half, and cream cheese. Whole-fat or sweetened yogurt. Full-fat cheeses or blue cheese. Nondairy creamers and whipped toppings. Processed cheese, cheese spreads, or cheese curds. Condiments Onion and garlic salt, seasoned salt, table salt, and sea salt. Canned and packaged gravies. Worcestershire sauce. Tartar sauce. Barbecue sauce. Teriyaki sauce. Soy sauce, including reduced sodium. Steak sauce. Fish sauce. Oyster sauce. Cocktail sauce. Horseradish. Ketchup and mustard. Meat flavorings and tenderizers. Bouillon cubes. Hot sauce. Tabasco sauce. Marinades. Taco seasonings. Relishes. Fats and Oils Butter, stick margarine,  lard, shortening, ghee, and bacon fat. Coconut, palm kernel, or palm oils. Regular salad dressings. Other Pickles and olives. Salted popcorn and pretzels. The items listed above may not be a complete list of foods and beverages to avoid. Contact your dietitian for more information. WHERE CAN I FIND MORE INFORMATION? National Heart, Lung, and Blood Institute: travelstabloid.com   This information is not intended to replace advice given to you by your health care provider. Make sure you discuss any questions you have with your health care provider.   Document Released: 07/30/2011 Document Revised: 08/31/2014 Document Reviewed: 06/14/2013 Elsevier Interactive Patient Education 2016 [place] for [employer] Calories are energy you get from the things you eat and drink. Your body uses this energy to keep you going throughout the day. The number of calories you eat affects your weight. When you eat more calories than your body needs, your body stores the extra calories as fat. When you eat fewer calories than your body needs, your body burns fat to get the energy it needs. Calorie counting means keeping track of how many calories you eat and drink each day. If you make sure to eat fewer calories than your body needs, you should lose weight. In order for calorie counting to work, you will need to eat the number of calories that are right for you in a day to lose a healthy amount of weight per week. A healthy amount of weight to lose per week is usually 1-2  lb (0.5-0.9 kg). A dietitian can determine how many calories you need in a day and give you suggestions on how to reach your calorie goal.  WHAT IS MY MY PLAN? My goal is to have __________ calories per day.  If I have this many calories per day, I should lose around __________ pounds per week. WHAT DO I NEED TO KNOW ABOUT CALORIE COUNTING? In order to meet your daily calorie goal, you will  need to:  Find out how many calories are in each food you would like to eat. Try to do this before you eat.  Decide how much of the food you can eat.  Write down what you ate and how many calories it had. Doing this is called keeping a food log. WHERE DO I FIND CALORIE INFORMATION? The number of calories in a food can be found on a Nutrition Facts label. Note that all the information on a label is based on a specific serving of the food. If a food does not have a Nutrition Facts label, try to look up the calories online or ask your dietitian for help. HOW DO I DECIDE HOW MUCH TO EAT? To decide how much of the food you can eat, you will need to consider both the number of calories in one serving and the size of one serving. This information can be found on the Nutrition Facts label. If a food does not have a Nutrition Facts label, look up the information online or ask your dietitian for help. Remember that calories are listed per serving. If you choose to have more than one serving of a food, you will have to multiply the calories per serving by the amount of servings you plan to eat. For example, the label on a package of bread might say that a serving size is 1 slice and that there are 90 calories in a serving. If you eat 1 slice, you will have eaten 90 calories. If you eat 2 slices, you will have eaten 180 calories. HOW DO I KEEP A FOOD LOG? After each meal, record the following information in your food log:  What you ate.  How much of it you ate.  How many calories it had.  Then, add up your calories. Keep your food log near you, such as in a small notebook in your pocket. Another option is to use a mobile app or website. Some programs will calculate calories for you and show you how many calories you have left each time you add an item to the log. WHAT ARE SOME CALORIE COUNTING TIPS?  Use your calories on foods and drinks that will fill you up and not leave you hungry. Some examples of  this include foods like nuts and nut butters, vegetables, lean proteins, and high-fiber foods (more than 5 g fiber per serving).  Eat nutritious foods and avoid empty calories. Empty calories are calories you get from foods or beverages that do not have many nutrients, such as candy and soda. It is better to have a nutritious high-calorie food (such as an avocado) than a food with few nutrients (such as a bag of chips).  Know how many calories are in the foods you eat most often. This way, you do not have to look up how many calories they have each time you eat them.  Look out for foods that may seem like low-calorie foods but are really high-calorie foods, such as baked goods, soda, and fat-free candy.  Pay attention  to calories in drinks. Drinks such as sodas, specialty coffee drinks, alcohol, and juices have a lot of calories yet do not fill you up. Choose low-calorie drinks like water and diet drinks.  Focus your calorie counting efforts on higher calorie items. Logging the calories in a garden salad that contains only vegetables is less important than calculating the calories in a milk shake.  Find a way of tracking calories that works for you. Get creative. Most people who are successful find ways to keep track of how much they eat in a day, even if they do not count every calorie. WHAT ARE SOME PORTION CONTROL TIPS?  Know how many calories are in a serving. This will help you know how many servings of a certain food you can have.  Use a measuring cup to measure serving sizes. This is helpful when you start out. With time, you will be able to estimate serving sizes for some foods.  Take some time to put servings of different foods on your favorite plates, bowls, and cups so you know what a serving looks like.  Try not to eat straight from a bag or box. Doing this can lead to overeating. Put the amount you would like to eat in a cup or on a plate to make sure you are eating the right  portion.  Use smaller plates, glasses, and bowls to prevent overeating. This is a quick and easy way to practice portion control. If your plate is smaller, less food can fit on it.  Try not to multitask while eating, such as watching TV or using your computer. If it is time to eat, sit down at a table and enjoy your food. Doing this will help you to start recognizing when you are full. It will also make you more aware of what and how much you are eating. HOW CAN I CALORIE COUNT WHEN EATING OUT?  Ask for smaller portion sizes or child-sized portions.  Consider sharing an entree and sides instead of getting your own entree.  If you get your own entree, eat only half. Ask for a box at the beginning of your meal and put the rest of your entree in it so you are not tempted to eat it.  Look for the calories on the menu. If calories are listed, choose the lower calorie options.  Choose dishes that include vegetables, fruits, whole grains, low-fat dairy products, and lean protein. Focusing on smart food choices from each of the 5 food groups can help you stay on track at restaurants.  Choose items that are boiled, broiled, grilled, or steamed.  Choose water, milk, unsweetened iced tea, or other drinks without added sugars. If you want an alcoholic beverage, choose a lower calorie option. For example, a regular margarita can have up to 700 calories and a glass of wine has around 150.  Stay away from items that are buttered, battered, fried, or served with cream sauce. Items labeled "crispy" are usually fried, unless stated otherwise.  Ask for dressings, sauces, and syrups on the side. These are usually very high in calories, so do not eat much of them.  Watch out for salads. Many people think salads are a healthy option, but this is often not the case. Many salads come with bacon, fried chicken, lots of cheese, fried chips, and dressing. All of these items have a lot of calories. If you want a salad,  choose a garden salad and ask for grilled meats or steak. Ask  for the dressing on the side, or ask for olive oil and vinegar or lemon to use as dressing.  Estimate how many servings of a food you are given. For example, a serving of cooked rice is  cup or about the size of half a tennis ball or one cupcake wrapper. Knowing serving sizes will help you be aware of how much food you are eating at restaurants. The list below tells you how big or small some common portion sizes are based on everyday objects.  1 oz--4 stacked dice.  3 oz--1 deck of cards.  1 tsp--1 dice.  1 Tbsp-- a Ping-Pong ball.  2 Tbsp--1 Ping-Pong ball.   cup--1 tennis ball or 1 cupcake wrapper.  1 cup--1 baseball.   This information is not intended to replace advice given to you by your health care provider. Make sure you discuss any questions you have with your health care provider.   Document Released: 08/10/2005 Document Revised: 08/31/2014 Document Reviewed: 06/15/2013 Elsevier Interactive Patient Education 2016 [place] [employer] Exercising can help you to lose weight. In order to lose weight through exercise, you need to do vigorous-intensity exercise. You can tell that you are exercising with vigorous intensity if you are breathing very hard and fast and cannot hold a conversation while exercising. Moderate-intensity exercise helps to maintain your current weight. You can tell that you are exercising at a moderate level if you have a higher heart rate and faster breathing, but you are still able to hold a conversation. HOW OFTEN SHOULD I EXERCISE? Choose an activity that you enjoy and set realistic goals. Your health care provider can help you to make an activity plan that works for you. Exercise regularly as directed by your health care provider. This may include:  Doing resistance training twice each week, such as:  Push-ups.  Sit-ups.  Lifting weights.  Using resistance  bands.  Doing a given intensity of exercise for a given amount of time. Choose from these options:  150 minutes of moderate-intensity exercise every week.  75 minutes of vigorous-intensity exercise every week.  A mix of moderate-intensity and vigorous-intensity exercise every week. Children, pregnant women, people who are out of shape, people who are overweight, and older adults may need to consult a health care provider for individual recommendations. If you have any sort of medical condition, be sure to consult your health care provider before starting a new exercise program. WHAT ARE SOME ACTIVITIES THAT CAN HELP ME TO LOSE WEIGHT?   Walking at a rate of at least 4.5 miles an hour.  Jogging or running at a rate of 5 miles per hour.  Biking at a rate of at least 10 miles per hour.  Lap swimming.  Roller-skating or in-line skating.  Cross-country skiing.  Vigorous competitive sports, such as football, basketball, and soccer.  Jumping rope.  Aerobic dancing. HOW CAN I BE MORE ACTIVE IN MY DAY-TO-DAY ACTIVITIES?  Use the stairs instead of the elevator.  Take a walk during your lunch break.  If you drive, park your car farther away from work or school.  If you take public transportation, get off one stop early and walk the rest of the way.  Make all of your phone calls while standing up and walking around.  Get up, stretch, and walk around every 30 minutes throughout the day. WHAT GUIDELINES SHOULD I FOLLOW WHILE EXERCISING?  Do not exercise so much that you hurt yourself, feel dizzy,  or get very short of breath.  Consult your health care provider prior to starting a new exercise program.  Wear comfortable clothes and shoes with good support.  Drink plenty of water while you exercise to prevent dehydration or heat stroke. Body water is lost during exercise and must be replaced.  Work out until you breathe faster and your heart beats faster.   This information is  not intended to replace advice given to you by your health care provider. Make sure you discuss any questions you have with your health care provider.     Document Released: 09/12/2010 Document Revised: 08/31/2014 Document Reviewed: 01/11/2014 Elsevier Interactive Patient Education 2016 Elsevier Inc.  Influenza Virus Vaccine injection (Fluarix) What is this medicine? INFLUENZA VIRUS VACCINE (in floo EN zuh VAHY ruhs vak SEEN) helps to reduce the risk of getting influenza also known as the flu. This medicine may be used for other purposes; ask your health care provider or pharmacist if you have questions. What should I tell my health care provider before I take this medicine? They need to know if you have any of these conditions: -bleeding disorder like hemophilia -fever or infection -Guillain-Barre syndrome or other neurological problems -immune system problems -infection with the human immunodeficiency virus (HIV) or AIDS -low blood platelet counts -multiple sclerosis -an unusual or allergic reaction to influenza virus vaccine, eggs, chicken proteins, latex, gentamicin, other medicines, foods, dyes or preservatives -pregnant or trying to get pregnant -breast-feeding How should I use this medicine? This vaccine is for injection into a muscle. It is given by a health care professional. A copy of Vaccine Information Statements will be given before each vaccination. Read this sheet carefully each time. The sheet may change frequently. Talk to your pediatrician regarding the use of this medicine in children. Special care may be needed. Overdosage: If you think you have taken too much of this medicine contact a poison control center or emergency room at once. NOTE: This medicine is only for you. Do not share this medicine with others. What if I miss a dose? This does not apply. What may interact with this medicine? -chemotherapy or radiation therapy -medicines that lower your immune  system like etanercept, anakinra, infliximab, and adalimumab -medicines that treat or prevent blood clots like warfarin -phenytoin -steroid medicines like prednisone or cortisone -theophylline -vaccines This list may not describe all possible interactions. Give your health care provider a list of all the medicines, herbs, non-prescription drugs, or dietary supplements you use. Also tell them if you smoke, drink alcohol, or use illegal drugs. Some items may interact with your medicine. What should I watch for while using this medicine? Report any side effects that do not go away within 3 days to your doctor or health care professional. Call your health care provider if any unusual symptoms occur within 6 weeks of receiving this vaccine. You may still catch the flu, but the illness is not usually as bad. You cannot get the flu from the vaccine. The vaccine will not protect against colds or other illnesses that may cause fever. The vaccine is needed every year. What side effects may I notice from receiving this medicine? Side effects that you should report to your doctor or health care professional as soon as possible: -allergic reactions like skin rash, itching or hives, swelling of the face, lips, or tongue Side effects that usually do not require medical attention (report to your doctor or health care professional if they continue or are bothersome): -fever -headache -  muscle aches and pains -pain, tenderness, redness, or swelling at site where injected -weak or tired This list may not describe all possible side effects. Call your doctor for medical advice about side effects. You may report side effects to FDA at 1-800-FDA-1088. Where should I keep my medicine? This vaccine is only given in a clinic, pharmacy, doctor's office, or other health care setting and will not be stored at home. NOTE: This sheet is a summary. It may not cover all possible information. If you have questions about this  medicine, talk to your doctor, pharmacist, or health care provider.    2016, Elsevier/[employer]. (2008-03-07 09:30:40)

## 2016-04-14 NOTE — Progress Notes (Signed)
Christina Kirby, is a 50 y.o. female  [id number]  [id number]  DOB - 10-17-1965  CC:  Chief Complaint  Patient presents with  . Headache  . Establish Care       HPI: Christina Kirby is a 50 y.o. female [place] here today to establish medical care, last seen in clinic 3/17, w/ hx of borderline bp (currently not on rx trx) and thallasemia anemia.  Pt states that when she [place] country in [place], had TDAP and hiv screening.  Walks daily, but does cook w/ salt. Does not eat many processed foods.  She is trying to lose some weight, but has hard time getting weight off.  Does not smoke or drink.  She has 2 older children, 1 dgt sophomore thinking about going into Medicine, and younger 54 yo son.    Patient has No headache, No chest pain, No abdominal pain - No Nausea, No new weakness tingling or numbness, No Cough - SOB.    Review of Systems: Per hPI, o/w all systems reviewed and negative.   Allergies  Allergen Reactions  . Sulfa Antibiotics Hives   Past Medical History:  Diagnosis Date  . Hypertension   . Thalassemia    No current outpatient prescriptions on file prior to visit.   No current facility-administered medications on file prior to visit.    Family History  Problem Relation Age of Onset  . Family history unknown: Yes   Social History   Social History  . Marital status: Married    Spouse name: N/A  . Number of children: N/A  . Years of education: N/A   Occupational History  . Not on file.   Social History Main Topics  . Smoking status: Never Smoker  . Smokeless tobacco: Never Used  . Alcohol use No  . Drug use: No  . Sexual activity: Not on file   Other Topics Concern  . Not on file   Social History Narrative  . No narrative on file    Objective:   Vitals:   04/14/16 1031  BP: (!) 135/96  Pulse: 77  Resp: 16  Temp: 98.2 F (36.8 C)    Filed Weights   04/14/16 1031  Weight: 178 lb 6.4 oz (80.9 kg)    BP  Readings from Last 3 Encounters:  04/14/16 (!) 135/96  11/06/15 135/81  11/04/15 130/90    Physical Exam: Constitutional: Patient appears well-developed and well-nourished. No distress. AAOx3, obese, pleasant. HENT: Normocephalic, atraumatic, External right and left ear normal. Oropharynx is clear and moist.  Right TM clear, cerumen impaction left ear. Eyes: Conjunctivae and EOM are normal. PERRL, no scleral icterus. Neck: Normal ROM. Neck supple. No JVD. Marland Kitchen No thyromegaly. CVS: RRR, S1/S2 +, no murmurs, no gallops, no carotid bruit.  Pulmonary: Effort and breath sounds normal, no stridor, rhonchi, wheezes, rales.  Abdominal: Soft. BS +,  Obese, no distension, tenderness, rebound or guarding.  Musculoskeletal: Normal range of motion. No edema and no tenderness.  LE: bilat/ no c/c/e, pulses 2+ bilateral. Neuro: Alert. muscle tone coordination wnl. No cranial nerve deficit grossly. Skin: Skin is warm and dry. No rash noted. Not diaphoretic. No erythema. No pallor. Psychiatric: Normal mood and affect. Behavior, judgment, thought content normal.  Lab Results  Component Value Date   WBC 3.7 (L) 11/04/2015   HGB 10.2 (L) 11/04/2015   HCT 33.2 (L) 11/04/2015   MCV 70.8 (L) 11/04/2015   PLT 111 (L) 11/04/2015   Lab Results  Component Value  Date   CREATININE 0.59 11/04/2015   BUN <5 (L) 11/04/2015   NA 137 11/04/2015   K 3.5 11/04/2015   CL 107 11/04/2015   CO2 25 11/04/2015    No results found for: HGBA1C Lipid Panel  No results found for: CHOL, TRIG, HDL, CHOLHDL, VLDL, LDLCALC     Depression screen [facility] 2/9 04/14/2016 11/06/2015  Decreased Interest 0 0  Down, Depressed, Hopeless 0 0  PHQ - 2 Score 0 0    Assessment and plan:    1. Essential htn - borderline, encouraged increase exercise, at least 47mins/day, less 2 grm salt. - if remains elevated next time, will start hctz, pt wants to hold off on bpp meds for now.  2. Diabetes mellitus screening - HgB A1c  3. Breast  cancer screening  (has 100% cone discount) - MM Digital Screening; Future  4. Colon cancer screening - Ambulatory referral to Gastroenterology   5. Ceruminosis, left - Ear Lavage   6. Anemia, unspecified anemia type, probably related to thalassemia, but will chk iron studies as well. - Iron, TIBC and Ferritin Panel  7. Thalassemia, unspecified thalassemia type  8. Morbid obesity, unspecified obesity type ([place]) - TSH - info provided about weight loss, increasing exercise, watching diet better  9. Encounter for vitamin deficiency screening - Vitamin D, 25-hydroxy  10.   Flu vaccine need - Flu Vaccine QUAD 36+ mos PF IM (Fluarix & Fluzone Quad PF)  11. Health maintenance Per pt, had papsmear 2015, nml tdap and hiv screening 3/17 nml as well per pt.  Return in about 3 months (around 07/15/2016).  The patient was given clear instructions to go to ER or return to medical center if symptoms don't improve, worsen or new problems develop. The patient verbalized understanding. The patient was told to call to get lab results if they haven't heard anything in the next week.    This note has been created with Surveyor, quantity. Any transcriptional errors are unintentional.   Maren Reamer, MD, [place], [place]   04/14/2016, 11:11 AM

## 2016-04-15 ENCOUNTER — Other Ambulatory Visit: Payer: Self-pay | Admitting: Internal Medicine

## 2016-04-15 ENCOUNTER — Telehealth: Payer: Self-pay

## 2016-04-15 LAB — IRON,TIBC AND FERRITIN PANEL
%SAT: 28 % (ref 11–50)
Ferritin: 138 ng/mL (ref 10–232)
Iron: 87 ug/dL (ref 45–160)
TIBC: 312 ug/dL (ref 250–450)

## 2016-04-15 LAB — TSH: TSH: 1.03 mIU/L

## 2016-04-15 LAB — VITAMIN D 25 HYDROXY (VIT D DEFICIENCY, FRACTURES): Vit D, 25-Hydroxy: 23 ng/mL — ABNORMAL LOW (ref 30–100)

## 2016-04-15 MED ORDER — METFORMIN HCL 500 MG PO TABS: 500.0000 mg | ORAL_TABLET | Freq: Two times a day (BID) | ORAL | 3 refills | Status: DC

## 2016-04-15 MED FILL — ?METFORMIN HCL 500MG TABLET: 500 | 30 days supply | Qty: 60 | Fill #0

## 2016-04-15 NOTE — Telephone Encounter (Signed)
Contacted pt to go over lab results. Pt is aware of results and doesn't have any questions or concerns, Pt is aware of medication that has been sent to the pharmacy

## 2016-04-15 NOTE — Progress Notes (Signed)
Note opened in error.

## 2016-04-16 ENCOUNTER — Telehealth: Payer: Self-pay | Admitting: *Deleted

## 2016-04-16 NOTE — Telephone Encounter (Signed)
Hi Christina Kirby,  Just an [place]. This patient has Golden. Her procedure is scheduled 05/14/16 with Dr. Loletha Carrow.  Thank you. Olivia Mackie

## 2016-04-29 ENCOUNTER — Ambulatory Visit (AMBULATORY_SURGERY_CENTER): Payer: Self-pay | Admitting: *Deleted

## 2016-04-29 VITALS — Ht 64.0 in | Wt 178.6 lb

## 2016-04-29 DIAGNOSIS — Z1211 Encounter for screening for malignant neoplasm of colon: Secondary | ICD-10-CM

## 2016-04-29 NOTE — Progress Notes (Signed)
No egg or soy allergy  No anesthesia or intubation problems per pt  No diet medications taken  No home oxygen used   

## 2016-04-29 NOTE — Progress Notes (Signed)
Pt needs a sample given and note was sent to L Wrenn 04-16-16.  Instructed pt to call by 05-08-16 if she hadn't heard from [facility] [phone]

## 2016-05-11 ENCOUNTER — Telehealth: Payer: Self-pay | Admitting: Gastroenterology

## 2016-05-11 NOTE — Telephone Encounter (Signed)
Spoke with patient and told her I would leave a Suprep sample up front for her to pick up.  Patient agreed.

## 2016-05-14 ENCOUNTER — Ambulatory Visit (AMBULATORY_SURGERY_CENTER): Payer: Self-pay | Admitting: Gastroenterology

## 2016-05-14 ENCOUNTER — Encounter: Payer: Self-pay | Admitting: Gastroenterology

## 2016-05-14 VITALS — BP 134/89 | HR 82 | Temp 97.1°F | Resp 20 | Ht 64.0 in | Wt 178.0 lb

## 2016-05-14 DIAGNOSIS — Z1211 Encounter for screening for malignant neoplasm of colon: Secondary | ICD-10-CM

## 2016-05-14 LAB — GLUCOSE, CAPILLARY
Glucose-Capillary: 99 mg/dL (ref 65–99)
Glucose-Capillary: 85 mg/dL (ref 65–99)

## 2016-05-14 MED ORDER — SODIUM CHLORIDE 0.9 % IV SOLN: 500.0000 mL | INTRAVENOUS | Status: DC

## 2016-05-14 NOTE — Op Note (Signed)
[facility] Patient Name: Christina Kirby Procedure Date: 05/14/2016 1:34 PM MRN: [id number] Endoscopist: Mallie Mussel L. Loletha Carrow , MD Age: 50 Referring MD:  Date of Birth: April 28, 1966 Gender: Female Account #: [id number] Procedure:                Colonoscopy Indications:              Screening for colorectal malignant neoplasm, This                            is the patient's first colonoscopy Medicines:                Monitored Anesthesia Care Procedure:                Pre-Anesthesia Assessment:                           - Prior to the procedure, a History and Physical                            was performed, and patient medications and                            allergies were reviewed. The patient's tolerance of                            previous anesthesia was also reviewed. The risks                            and benefits of the procedure and the sedation                            options and risks were discussed with the patient.                            All questions were answered, and informed consent                            was obtained. Prior Anticoagulants: The patient has                            taken no previous anticoagulant or antiplatelet                            agents. ASA Grade Assessment: II - A patient with                            mild systemic disease. After reviewing the risks                            and benefits, the patient was deemed in                            satisfactory condition to undergo the procedure.  After obtaining informed consent, the colonoscope                            was passed under direct vision. Throughout the                            procedure, the patient's blood pressure, pulse, and                            oxygen saturations were monitored continuously. The                            Model CF-HQ190L [id number]) scope was introduced                            through the anus and  advanced to the the cecum,                            identified by appendiceal orifice and ileocecal                            valve. The colonoscopy was performed without                            difficulty. The patient tolerated the procedure                            well. The quality of the bowel preparation was                            excellent. The ileocecal valve, appendiceal                            orifice, and rectum were photographed. The quality                            of the bowel preparation was evaluated using the                            BBPS [facility] Bowel Preparation Scale) with scores                            of: Right Colon = 3, Transverse Colon = 3 and Left                            Colon = 3 (entire mucosa seen well with no residual                            staining, small fragments of stool or opaque                            liquid). The total BBPS score equals 9. The bowel  preparation used was SUPREP. Scope In: 1:39:10 PM Scope Out: 1:52:41 PM Scope Withdrawal Time: 0 hours 7 minutes 17 seconds  Total Procedure Duration: 0 hours 13 minutes 31 seconds  Findings:                 The perianal and digital rectal examinations were                            normal.                           Internal hemorrhoids were found during                            retroflexion. The hemorrhoids were small and Grade                            I (internal hemorrhoids that do not prolapse).                           The exam was otherwise without abnormality. Complications:            No immediate complications. Estimated Blood Loss:     Estimated blood loss: none. Impression:               - Internal hemorrhoids.                           - The examination was otherwise normal.                           - No specimens collected. Recommendation:           - Patient has a contact number available for                             emergencies. The signs and symptoms of potential                            delayed complications were discussed with the                            patient. Return to normal activities tomorrow.                            Written discharge instructions were provided to the                            patient.                           - Resume previous diet.                           - Continue present medications.                           - Repeat colonoscopy in 10 years for screening  purposes. Henry L. Loletha Carrow, MD 05/14/2016 1:56:07 PM This report has been signed electronically.

## 2016-05-14 NOTE — Patient Instructions (Signed)
YOU HAD AN ENDOSCOPIC PROCEDURE TODAY AT THE [facility]:   Refer to the procedure report that was given to you for any specific questions about what was found during the examination.  If the procedure report does not answer your questions, please call your gastroenterologist to clarify.  If you requested that your care partner not be given the details of your procedure findings, then the procedure report has been included in a sealed envelope for you to review at your convenience later.  YOU SHOULD EXPECT: Some feelings of bloating in the abdomen. Passage of more gas than usual.  Walking can help get rid of the air that was put into your GI tract during the procedure and reduce the bloating. If you had a lower endoscopy (such as a colonoscopy or flexible sigmoidoscopy) you may notice spotting of blood in your stool or on the toilet paper. If you underwent a bowel prep for your procedure, you may not have a normal bowel movement for a few days.  Please Note:  You might notice some irritation and congestion in your nose or some drainage.  This is from the oxygen used during your procedure.  There is no need for concern and it should clear up in a day or so.  SYMPTOMS TO REPORT IMMEDIATELY:   Following lower endoscopy (colonoscopy or flexible sigmoidoscopy):  Excessive amounts of blood in the stool  Significant tenderness or worsening of abdominal pains  Swelling of the abdomen that is new, acute  Fever of 100F or higher    For urgent or emergent issues, a gastroenterologist can be reached at any hour by calling [phone].   DIET:  We do recommend a small meal at first, but then you may proceed to your regular diet.  Drink plenty of fluids but you should avoid alcoholic beverages for 24 hours.  ACTIVITY:  You should plan to take it easy for the rest of today and you should NOT DRIVE or use heavy machinery until tomorrow (because of the sedation medicines used during the test).     FOLLOW UP: Our staff will call the number listed on your records the next business day following your procedure to check on you and address any questions or concerns that you may have regarding the information given to you following your procedure. If we do not reach you, we will leave a message.  However, if you are feeling well and you are not experiencing any problems, there is no need to return our call.  We will assume that you have returned to your regular daily activities without incident.  If any biopsies were taken you will be contacted by phone or by letter within the next 1-3 weeks.  Please call us at [phone] if you have not heard about the biopsies in 3 weeks.    SIGNATURES/CONFIDENTIALITY: You and/or your care partner have signed paperwork which will be entered into your electronic medical record.  These signatures attest to the fact that that the information above on your After Visit Summary has been reviewed and is understood.  Full responsibility of the confidentiality of this discharge information lies with you and/or your care-partner.    Information on hemorrhoids given to you today 

## 2016-05-15 ENCOUNTER — Telehealth: Payer: Self-pay

## 2016-05-15 NOTE — Telephone Encounter (Signed)
  Follow up Call-  Call back number 05/14/2016  Post procedure Call Back phone  # [phone]  Permission to leave phone message Yes    Patient expressed how happy she was with the care that she received in the endoscopy center. She states we are a great team!   Patient questions:  Do you have a fever, pain , or abdominal swelling? No. Pain Score  0 *  Have you tolerated food without any problems? Yes.    Have you been able to return to your normal activities? Yes.    Do you have any questions about your discharge instructions: Diet   No. Medications  No. Follow up visit  No.  Do you have questions or concerns about your Care? No.  Actions: * If pain score is 4 or above: No action needed, pain <4.

## 2016-09-03 MED FILL — metFORMIN HCL 500 MG TABS: 500 | 30 days supply | Qty: 60 | Fill #1

## 2016-09-07 ENCOUNTER — Ambulatory Visit: Payer: BLUE CROSS/BLUE SHIELD | Attending: Internal Medicine | Admitting: Internal Medicine

## 2016-09-07 ENCOUNTER — Encounter: Payer: Self-pay | Admitting: Internal Medicine

## 2016-09-07 VITALS — BP 151/101 | HR 75 | Temp 97.9°F | Resp 16 | Wt 178.6 lb

## 2016-09-07 DIAGNOSIS — R7303 Prediabetes: Secondary | ICD-10-CM | POA: Diagnosis not present

## 2016-09-07 DIAGNOSIS — I10 Essential (primary) hypertension: Secondary | ICD-10-CM | POA: Diagnosis not present

## 2016-09-07 DIAGNOSIS — Z7984 Long term (current) use of oral hypoglycemic drugs: Secondary | ICD-10-CM | POA: Diagnosis not present

## 2016-09-07 DIAGNOSIS — E669 Obesity, unspecified: Secondary | ICD-10-CM | POA: Insufficient documentation

## 2016-09-07 DIAGNOSIS — E119 Type 2 diabetes mellitus without complications: Secondary | ICD-10-CM | POA: Diagnosis present

## 2016-09-07 DIAGNOSIS — Z1239 Encounter for other screening for malignant neoplasm of breast: Secondary | ICD-10-CM

## 2016-09-07 DIAGNOSIS — R05 Cough: Secondary | ICD-10-CM | POA: Diagnosis present

## 2016-09-07 DIAGNOSIS — E785 Hyperlipidemia, unspecified: Secondary | ICD-10-CM | POA: Diagnosis not present

## 2016-09-07 DIAGNOSIS — D569 Thalassemia, unspecified: Secondary | ICD-10-CM | POA: Diagnosis not present

## 2016-09-07 DIAGNOSIS — Z1231 Encounter for screening mammogram for malignant neoplasm of breast: Secondary | ICD-10-CM

## 2016-09-07 LAB — GLUCOSE, POCT (MANUAL RESULT ENTRY): POC Glucose: 112 mg/dl — AB (ref 70–99)

## 2016-09-07 LAB — CBC WITH DIFFERENTIAL/PLATELET
Basophils Absolute: 0 cells/uL (ref 0–200)
Basophils Relative: 0 %
Eosinophils Absolute: 68 cells/uL (ref 15–500)
Eosinophils Relative: 2 %
HCT: 36 % (ref 35.0–45.0)
Hemoglobin: 11 g/dL — ABNORMAL LOW (ref 11.7–15.5)
Lymphocytes Relative: 47 %
Lymphs Abs: 1598 cells/uL (ref 850–3900)
MCH: 21.5 pg — ABNORMAL LOW (ref 27.0–33.0)
MCHC: 30.6 g/dL — ABNORMAL LOW (ref 32.0–36.0)
MCV: 70.5 fL — ABNORMAL LOW (ref 80.0–100.0)
Monocytes Absolute: 238 cells/uL (ref 200–950)
Monocytes Relative: 7 %
Neutro Abs: 1496 cells/uL — ABNORMAL LOW (ref 1500–7800)
Neutrophils Relative %: 44 %
Platelets: 135 10*3/uL — ABNORMAL LOW (ref 140–400)
RBC: 5.11 MIL/uL — ABNORMAL HIGH (ref 3.80–5.10)
RDW: 16 % — ABNORMAL HIGH (ref 11.0–15.0)
WBC: 3.4 10*3/uL — ABNORMAL LOW (ref 3.8–10.8)

## 2016-09-07 LAB — BASIC METABOLIC PANEL WITH GFR
BUN: 11 mg/dL (ref 7–25)
CO2: 25 mmol/L (ref 20–31)
Calcium: 9.2 mg/dL (ref 8.6–10.4)
Chloride: 105 mmol/L (ref 98–110)
Creat: 0.64 mg/dL (ref 0.50–1.05)
GFR, Est African American: >89 mL/min (ref >=60)
GFR, Est Non African American: >89 mL/min (ref >=60)
Glucose, Bld: 106 mg/dL — ABNORMAL HIGH (ref 65–99)
Potassium: 4 mmol/L (ref 3.5–5.3)
Sodium: 139 mmol/L (ref 135–146)

## 2016-09-07 LAB — LIPID PANEL
Cholesterol: 206 mg/dL — ABNORMAL HIGH (ref <200)
HDL: 54 mg/dL (ref >50)
LDL Cholesterol: 135 mg/dL — ABNORMAL HIGH (ref <100)
Total CHOL/HDL Ratio: 3.8 Ratio (ref <5.0)
Triglycerides: 86 mg/dL (ref <150)
VLDL: 17 mg/dL (ref <30)

## 2016-09-07 LAB — POCT GLYCOSYLATED HEMOGLOBIN (HGB A1C): Hemoglobin A1C: 5.9

## 2016-09-07 MED ORDER — HYDROCHLOROTHIAZIDE 25 MG PO TABS: 25.0000 mg | ORAL_TABLET | Freq: Every day | ORAL | 3 refills | Status: DC

## 2016-09-07 MED ORDER — METFORMIN HCL 500 MG PO TABS: 500.0000 mg | ORAL_TABLET | Freq: Every day | ORAL | 3 refills | Status: DC

## 2016-09-07 MED FILL — HYDROCHLOROTHIAZIDE 25 MG T: 25 | 30 days supply | Qty: 30 | Fill #0

## 2016-09-07 NOTE — Progress Notes (Signed)
Christina Kirby, is a 51 y.o. female  [id number]  [id number]  DOB - [birth year]  Chief Complaint  Patient presents with  . Hypertension  . Diabetes  . Cough        Subjective:   Christina Kirby is a 51 y.o. female here today for a follow up visit.  Last seen 04/14/16, doing well, was in [place] from Sept- Dec 2017.  Ran out of metformin while in [place], just picked up refill today.  No c/o, watching her salt intake more, on Ms Deliah [place] diet. Exercise frequently as well.  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 Did not sleep well last night.   Does not smoke or drink etoh.   Patient has No headache, No chest pain, No abdominal pain - No Nausea, No new weakness tingling or numbness, No Cough - SOB.  No problems updated.  ALLERGIES: Allergies  Allergen Reactions  . Sulfa Antibiotics Hives    PAST MEDICAL HISTORY: Past Medical History:  Diagnosis Date  . Blood transfusion without reported diagnosis    with pregnancy  . Diabetes mellitus without complication ([place])    "pre diabetic" per pt's MD, taking Metformin now  . Hyperlipidemia    no meds needed  . Hypertension   . Thalassemia     MEDICATIONS AT HOME: Prior to Admission medications   Medication Sig Start Date End Date Taking? Authorizing Provider  hydrochlorothiazide (HYDRODIURIL) 25 MG tablet Take 1 tablet (25 mg total) by mouth daily. 09/07/16   Maren Reamer, MD  metFORMIN (GLUCOPHAGE) 500 MG tablet Take 1 tablet (500 mg total) by mouth daily with breakfast. 09/07/16   Maren Reamer, MD  Multiple Vitamins-Minerals (MULTIVITAMIN ADULTS 50+) TABS Take by mouth daily.    Historical Provider, MD     Objective:   Vitals:   09/07/16 1138  BP: (!) 151/101  Pulse: 75  Resp: 16  Temp: 97.9 F (36.6 C)  TempSrc: Oral  SpO2: 96%  Weight: 178 lb 9.6 oz (81 kg)    Exam General appearance : Awake, alert, not in any distress. Speech Clear. Not toxic looking,  pleasant, obese. HEENT: Atraumatic and Normocephalic, pupils equally reactive to light. bilat TMs clear. Neck: supple, no JVD. No cervical lymphadenopathy.  Chest:Good air entry bilaterally, no added sounds. CVS: S1 S2 regular, no murmurs/gallups or rubs. Abdomen: Bowel sounds active, abd obesity,  Non tender and not distended with no gaurding, rigidity or rebound. Extremities: B/L Lower Ext shows no edema, both legs are warm to touch Neurology: Awake alert, and oriented X 3, CN II-XII grossly intact, Non focal Skin:No Rash  Data Review Lab Results  Component Value Date   HGBA1C 6.0 04/14/2016    Depression screen [facility] 2/9 09/07/2016 04/14/2016 11/06/2015  Decreased Interest 0 0 0  Down, Depressed, Hopeless 0 0 0  PHQ - 2 Score 0 0 0      Assessment & Plan   1. Essential hypertension Persistently elevated after 2 visits, dw pt low salt diet, but sounds like she has eliminated a lot of salt from her diet already, on Ms[employer]ash diet. - start hctz 25 qd. - BASIC METABOLIC PANEL WITH GFR - fu w/ RN 2 wks, if sbp remains elevated, chg hctz to prinzide 10-12.5 qd  2. Thalassemia, unspecified type - CBC with Differential  3. Prediabetes Doing great, continue low  Carb diet./exercise - decrease metformin to 500 qd, if can lose 5-10 lbs, suspect can get to qod  w/ metformin as well. - Lipid Panel - POCT glucose (manual entry) - POCT glycosylated hemoglobin (Hb A1C)5.6 (from 6.0 8/17)  4. Breast cancer screening - MM Digital Screening; Future  5. uptodate on colonoscopy screening 9/17, repeat in 10years  6. Obesity - recd low carb diet, 5-10 lbs weight loss would be very beneficial to her. Info on lc/hf/if diet provided.     Patient have been counseled extensively about nutrition and exercise  Return in about 3 months (around 12/06/2016), or if symptoms worsen or fail to improve.  The patient was given clear instructions to go to ER or return to medical center if symptoms don't  improve, worsen or new problems develop. The patient verbalized understanding. The patient was told to call to get lab results if they haven't heard anything in the next week.   This note has been created with Surveyor, quantity. Any transcriptional errors are unintentional.   Maren Reamer, MD, [place] [facility] [place], [place]   09/07/2016, 12:03 PM

## 2016-09-07 NOTE — Patient Instructions (Addendum)
- fu with RN , Carilyn Goodpasture - bp check 2 wks  -  QUICK START PATIENT GUIDE TO LCHF/IF LOW CARB HIGH FAT / INTERMITTENT FASTING  Recommend: <50 gram carbohydrate a day for weightloss.  What is this diet and how does it work? o Insulin is a hormone made by your body that allows you to use sugar (glucose) from carbohydrates in the food you eat for energy or to store glucose (as fat) for future use  o Insulin levels need to be lowered in order to utilize our stored energy (fat) o Many struggling with obesity are insulin resistant and have high levels of insulin o This diet works to lower your insulin in two ways o Fasting - allows your insulin levels to naturally decrease  o Avoiding carbohydrates - carbs trigger increase in insulin Low Carb Healthy Fat (LCHF) o Get a free app for your phone, such as MyFitnessPal, to help you track your macronutrients (carbs/protein/fats) and to track your weight and body measurements to see your progress o Set your goal for around 10% carbs/20% protein/70% fat o A good starting goal for amount of net carbs per day is 50 grams (some will aim for 20 grams) o "Net carbs" refers to total grams of carbs minus grams of fiber (as fiber is not typically absorbed). For example, if a food has 5g total carb and 3g fiber, that would be 2g net carbs o Increase healthy fats - eg. olive oil, eggs, nuts, avocado, cheese, butter, coconut, meats, fish o Avoid high carb foods - eg. bread, pasta, potatoes, rice, cookies, soda, juice, anything sugary o Buy full-fat ingredients (avoid low-fat versions, which often have more sugar) o No need to count calories, but pay close attention to grams of carbs on labels Intermittent Fasting (IF) o "Fasting" is going a period of time without eating - it helps to stay busy and well-hydrated o Purpose of fasting is to allow insulin levels to drop as low as possible, allowing your body to switch into fat-burning mode o With this diet there are many  approaches to fasting, but 16:8 and 24hr fasts are commonly used o 16:8 fast, usually 5-7 days a week - Fasting for 16 hours of the day, then eating all meals for the day over course of 8 hours. o 24 hour fast, usually 1-3 days a week - Typically eating one meal a day, then fasting until the next day. Plenty of fluids (and some salt to help you hold onto fluids) are recommended during longer fasts.  o During fasts certain beverages are still acceptable - water, sparkling water, bone broth, black tea or coffee, or tea/coffee with small amount of heavy whipping cream Special note for those on diabetic medications o Discuss your medications with your physician. You may need to hold your medication or adjust to only taking when eating. Diabetics should keep close track of their blood sugars when making any changes to diet/meds, to ensure they are staying within normal limits For more info about LCHF/IF o Watch "Therapeutic Fasting - Solving the Two-Compartment Problem" video by Dr. Sharman Cheek on YouTube (GreatestGyms.com.ee) for a great intro to these concepts o Read "The Obesity Code" and/or "The Complete Guide to Fasting" by Dr. Sharman Cheek o Go to www.dietdoctor.com for explanations, recipes, and infographics about foods to eat/avoid o Get a Free smartphone app that helps count carbohydrates  - ie MyKeto EXAMPLES TO GET STARTED Fasting Beverages -water (can add  tsp Pink Himalayan salt once or  twice a day to help stay hydrated for longer fasts) -Sparkling water (such as [employer] or similar; avoid any with artificial sweeteners)  -Bone broth (multiple recipes available online or can buy pre-made) -Tea or Coffee (Adding heavy whipping cream or coconut oil to your tea or coffee can be helpful if you find yourself getting too hungry during the fasts. Can also add cinnamon for flavor. Or "bulletproof coffee.") Low Carb Healthy Fat Breakfast (if not fasting) -eggs in butter or  olive oil with avocado -omelet with veggies and cheese  Lunch -hamburger with cheese and avocado wrapped in lettuce (no bun, no ketchup) -meat and cheese wrapped in lettuce (can dip in mustard or olive oil/vinegar/mayo) -salad with meat/cheese/nuts and higher fat dressing (vinaigrette or Ranch, etc) -tuna salad lettuce wrap -taco meat with cheese, sour cream, guacamole, cheese over lettuce  Dinner -steak with herb butter or Barnaise sauce -"Fathead" pizza (uses cheese and almond flour for the dough - several recipes available online) -roasted or grilled chicken with skin on, with low carb sauce (buffalo, garlic butter, alfredo, pesto, etc) -baked salmon with lemon butter -chicken alfredo with zucchini noodles -[place] butter chicken with low carb garlic naan -egg roll in a bowl  Side Dishes -mashed cauliflower (homemade or available in freezer section) -roast vegetables (green veggies that grow above ground rather than root veggies) with butter or cheese -Caprese salad (fresh mozzarella, tomato and basil with olive oil) -homemade low-carb coleslaw Snacks/Desserts (try to avoid unnecessary snacking and sweets in general) -celery or cucumber dipped in guacamole or sour cream dip -cheese and meat slices  -raspberries with whipped cream (can make homemade with no sugar added) -low carb [place] butter cake  AVOID - sugar, diet/regular soda, potatoes, breads, rice, pasta, candy, cookies, cakes, muffins, juice, high carb fruit (bananas, grapes), beer, ketchup, barbeque and other sweet sauces  -  Low-Sodium Eating Plan Sodium raises blood pressure and causes water to be held in the body. Getting less sodium from food will help lower your blood pressure, reduce any swelling, and protect your heart, liver, and kidneys. We get sodium by adding salt (sodium chloride) to food. Most of our sodium comes from canned, boxed, and frozen foods. Restaurant foods, fast foods, and pizza are also very  high in sodium. Even if you take medicine to lower your blood pressure or to reduce fluid in your body, getting less sodium from your food is important. What is my plan? Most people should limit their sodium intake to 2,300 mg a day. Your health care provider recommends that you limit your sodium intake to 000mg ___ a day. What do I need to know about this eating plan? For the low-sodium eating plan, you will follow these general guidelines:  Choose foods with a % Daily Value for sodium of less than 5% (as listed on the food label).  Use salt-free seasonings or herbs instead of table salt or sea salt.  Check with your health care provider or pharmacist before using salt substitutes.  Eat fresh foods.  Eat more vegetables and fruits.  Limit canned vegetables. If you do use them, rinse them well to decrease the sodium.  Limit cheese to 1 oz (28 g) per day.  Eat lower-sodium products, often labeled as "lower sodium" or "no salt added."  Avoid foods that contain monosodium glutamate (MSG). MSG is sometimes added to [place] food and some canned foods.  Check food labels (Nutrition Facts labels) on foods to learn how much sodium is in one serving.  Eat more home-cooked food and less restaurant, buffet, and fast food.  When eating at a restaurant, ask that your food be prepared with less salt, or no salt if possible. How do I read food labels for sodium information? The Nutrition Facts label lists the amount of sodium in one serving of the food. If you eat more than one serving, you must multiply the listed amount of sodium by the number of servings. Food labels may also identify foods as:  Sodium free-Less than 5 mg in a serving.  Very low sodium-35 mg or less in a serving.  Low sodium-140 mg or less in a serving.  Light in sodium-50% less sodium in a serving. For example, if a food that usually has 300 mg of sodium is changed to become light in sodium, it will have 150 mg of  sodium.  Reduced sodium-25% less sodium in a serving. For example, if a food that usually has 400 mg of sodium is changed to reduced sodium, it will have 300 mg of sodium. What foods can I eat? Grains  Low-sodium cereals, including oats, puffed wheat and rice, and shredded wheat cereals. Low-sodium crackers. Unsalted rice and pasta. Lower-sodium bread. Vegetables  Frozen or fresh vegetables. Low-sodium or reduced-sodium canned vegetables. Low-sodium or reduced-sodium tomato sauce and paste. Low-sodium or reduced-sodium tomato and vegetable juices. Fruits  Fresh, frozen, and canned fruit. Fruit juice. Meat and Other Protein Products  Low-sodium canned tuna and salmon. Fresh or frozen meat, poultry, seafood, and fish. Lamb. Unsalted nuts. Dried beans, peas, and lentils without added salt. Unsalted canned beans. Homemade soups without salt. Eggs. Dairy  Milk. Soy milk. Ricotta cheese. Low-sodium or reduced-sodium cheeses. Yogurt. Condiments  Fresh and dried herbs and spices. Salt-free seasonings. Onion and garlic powders. Low-sodium varieties of mustard and ketchup. Fresh or refrigerated horseradish. Lemon juice. Fats and Oils  Reduced-sodium salad dressings. Unsalted butter. Other  Unsalted popcorn and pretzels. The items listed above may not be a complete list of recommended foods or beverages. Contact your dietitian for more options.  What foods are not recommended? Grains  Instant hot cereals. Bread stuffing, pancake, and biscuit mixes. Croutons. Seasoned rice or pasta mixes. Noodle soup cups. Boxed or frozen macaroni and cheese. Self-rising flour. Regular salted crackers. Vegetables  Regular canned vegetables. Regular canned tomato sauce and paste. Regular tomato and vegetable juices. Frozen vegetables in sauces. Salted [place] fries. Olives. Angie Fava. Relishes. Sauerkraut. Salsa. Meat and Other Protein Products  Salted, canned, smoked, spiced, or pickled meats, seafood, or fish. Bacon,  ham, sausage, hot dogs, corned beef, chipped beef, and packaged luncheon meats. Salt pork. Jerky. Pickled herring. Anchovies, regular canned tuna, and sardines. Salted nuts. Dairy  Processed cheese and cheese spreads. Cheese curds. Blue cheese and cottage cheese. Buttermilk. Condiments  Onion and garlic salt, seasoned salt, table salt, and sea salt. Canned and packaged gravies. Worcestershire sauce. Tartar sauce. Barbecue sauce. Teriyaki sauce. Soy sauce, including reduced sodium. Steak sauce. Fish sauce. Oyster sauce. Cocktail sauce. Horseradish that you find on the shelf. Regular ketchup and mustard. Meat flavorings and tenderizers. Bouillon cubes. Hot sauce. Tabasco sauce. Marinades. Taco seasonings. Relishes. Fats and Oils  Regular salad dressings. Salted butter. Margarine. Ghee. Bacon fat. Other  Potato and tortilla chips. Corn chips and puffs. Salted popcorn and pretzels. Canned or dried soups. Pizza. Frozen entrees and pot pies. The items listed above may not be a complete list of foods and beverages to avoid. Contact your dietitian for more information.  This information is  not intended to replace advice given to you by your health care provider. Make sure you discuss any questions you have with your health care provider. Document Released: 01/30/2002 Document Revised: 01/16/2016 Document Reviewed: 06/14/2013 Elsevier Interactive Patient Education  2017 [employer].

## 2016-09-08 ENCOUNTER — Other Ambulatory Visit: Payer: Self-pay | Admitting: Internal Medicine

## 2016-09-08 MED ORDER — FERROUS SULFATE 325 (65 FE) MG PO TABS: 325.0000 mg | ORAL_TABLET | Freq: Every day | ORAL | 3 refills | Status: DC

## 2016-09-08 MED FILL — FERROUS SULFATE 325 MG TAB: 325 (65 FE) | 30 days supply | Qty: 30 | Fill #0

## 2016-09-11 ENCOUNTER — Telehealth: Payer: Self-pay

## 2016-09-11 NOTE — Telephone Encounter (Signed)
Contacted pt to go over lab results pt is aware of results and doesn't have any questions or concerns 

## 2016-09-21 ENCOUNTER — Ambulatory Visit: Payer: BLUE CROSS/BLUE SHIELD | Attending: Internal Medicine | Admitting: *Deleted

## 2016-09-21 VITALS — BP 120/86 | HR 86 | Wt 176.0 lb

## 2016-09-21 DIAGNOSIS — Z79899 Other long term (current) drug therapy: Secondary | ICD-10-CM | POA: Diagnosis not present

## 2016-09-21 DIAGNOSIS — I10 Essential (primary) hypertension: Secondary | ICD-10-CM | POA: Diagnosis not present

## 2016-09-21 NOTE — Progress Notes (Signed)
Pt here for f/u BP check after office visit on 09/07/2016. Pt denies chest pain, SOB, HA, new vison concerns, or generalized swelling.  Has been taking medications as prescribed.Blood pressure taken manually while patient is sitting.  Will notify patient's provider of BP, nurse visit will be routed to provider.Carilyn Goodpasture, RN, BSN

## 2016-10-16 MED FILL — FERROUS SULFATE 325 MG TAB: 325 (65 FE) | 30 days supply | Qty: 30 | Fill #1

## 2016-10-16 MED FILL — HYDROCHLOROTHIAZIDE 25 MG T: 25 | 30 days supply | Qty: 30 | Fill #1

## 2016-10-16 MED FILL — metFORMIN HCL 500 MG TABS: 500 | 30 days supply | Qty: 60 | Fill #2

## 2016-12-30 MED FILL — FERROUS SULFATE 325 MG TAB: 325 (65 FE) | 30 days supply | Qty: 30 | Fill #2

## 2016-12-30 MED FILL — HYDROCHLOROTHIAZIDE 25 MG T: 25 | 30 days supply | Qty: 30 | Fill #2

## 2016-12-30 MED FILL — metFORMIN HCL 500 MG TABS: 500 | 30 days supply | Qty: 60 | Fill #3

## 2017-01-07 ENCOUNTER — Encounter: Payer: Self-pay | Admitting: Internal Medicine

## 2017-01-08 ENCOUNTER — Encounter: Payer: Self-pay | Admitting: Internal Medicine

## 2017-01-11 ENCOUNTER — Encounter: Payer: Self-pay | Admitting: Internal Medicine

## 2018-01-13 ENCOUNTER — Other Ambulatory Visit: Payer: Self-pay | Admitting: Internal Medicine

## 2018-01-13 DIAGNOSIS — E2839 Other primary ovarian failure: Secondary | ICD-10-CM

## 2018-01-13 DIAGNOSIS — Z139 Encounter for screening, unspecified: Secondary | ICD-10-CM

## 2018-02-25 ENCOUNTER — Ambulatory Visit
Admission: RE | Admit: 2018-02-25 | Discharge: 2018-02-25 | Disposition: A | Discharge status: Home / Self Care | Payer: BLUE CROSS/BLUE SHIELD | Source: Ambulatory Visit | Attending: Internal Medicine | Admitting: Internal Medicine

## 2018-02-25 DIAGNOSIS — E2839 Other primary ovarian failure: Secondary | ICD-10-CM

## 2018-02-25 DIAGNOSIS — Z139 Encounter for screening, unspecified: Secondary | ICD-10-CM

## 2019-10-12 IMAGING — MG DIGITAL SCREENING BILATERAL MAMMOGRAM WITH TOMO AND CAD
8 series · 8 of 24 positions shown · non-contrast
Comparison: None.

CLINICAL DATA: Screening.

EXAM:
DIGITAL SCREENING BILATERAL MAMMOGRAM WITH TOMO AND CAD

[R CC synth-2D]
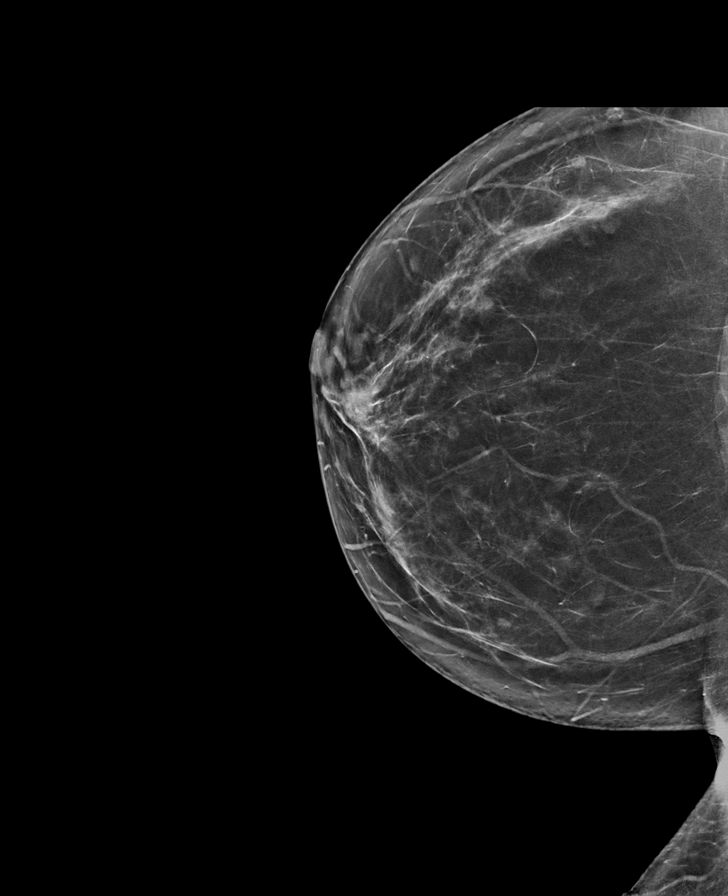

[R MLO synth-2D]
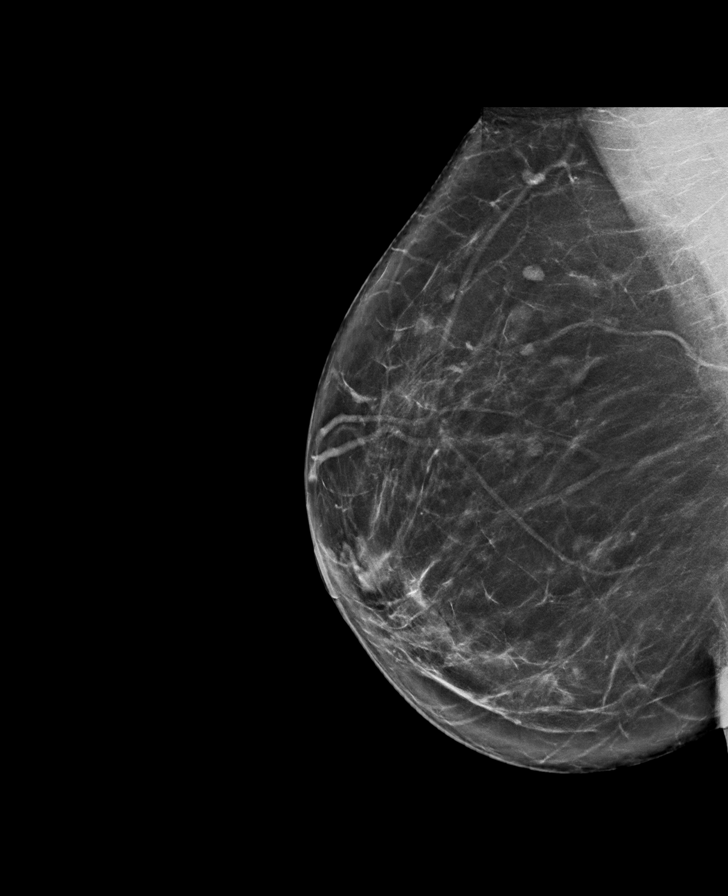

[L MLO synth-2D]
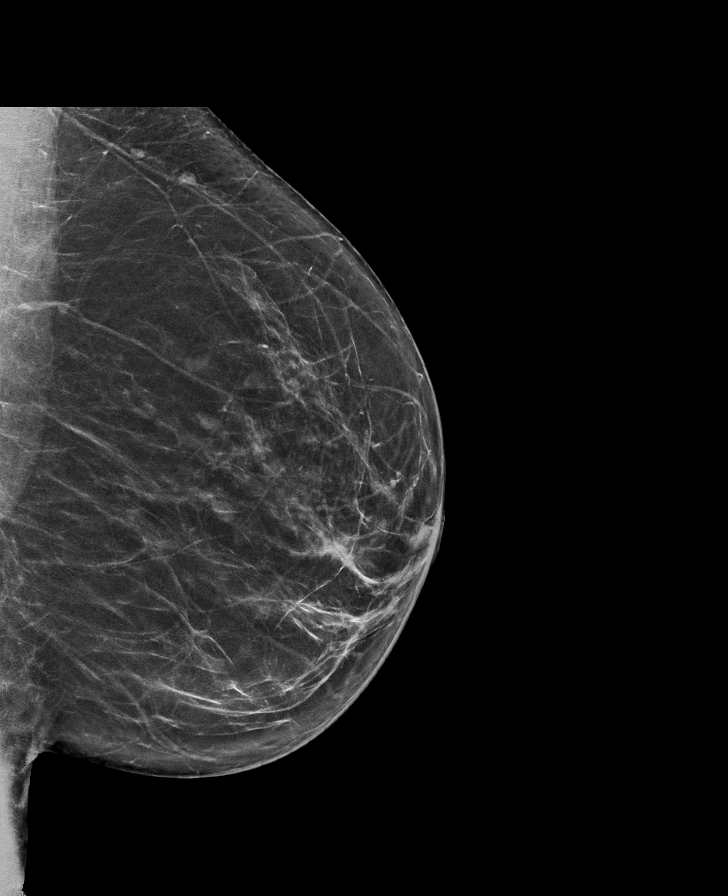

[L CC synth-2D]
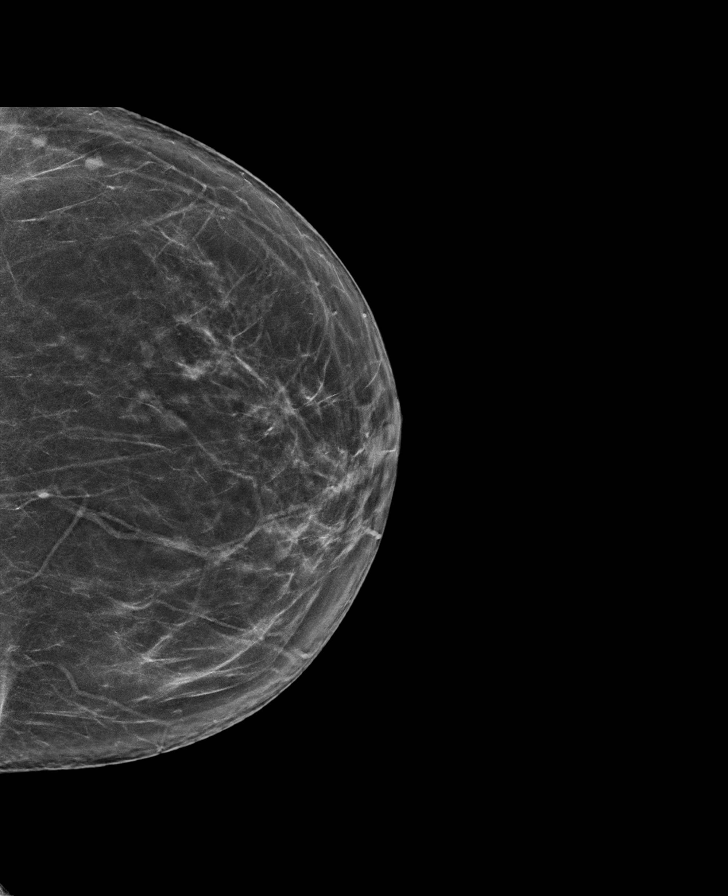

[R CC tomo · tomo slice 41/81.0]
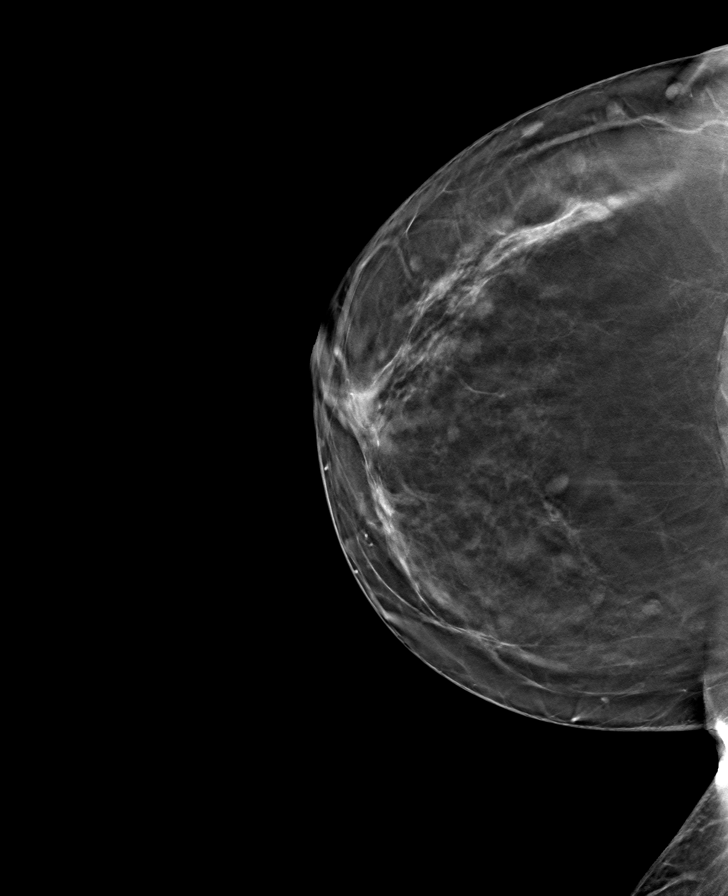

[L CC tomo · tomo slice 39/78.0]
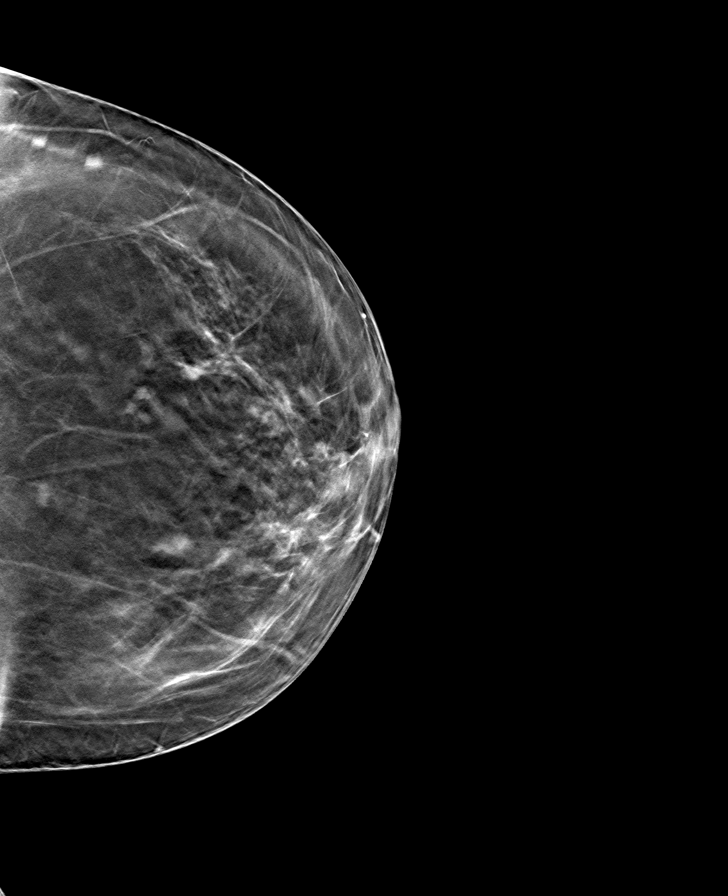

[R MLO tomo · tomo slice 45/88.0]
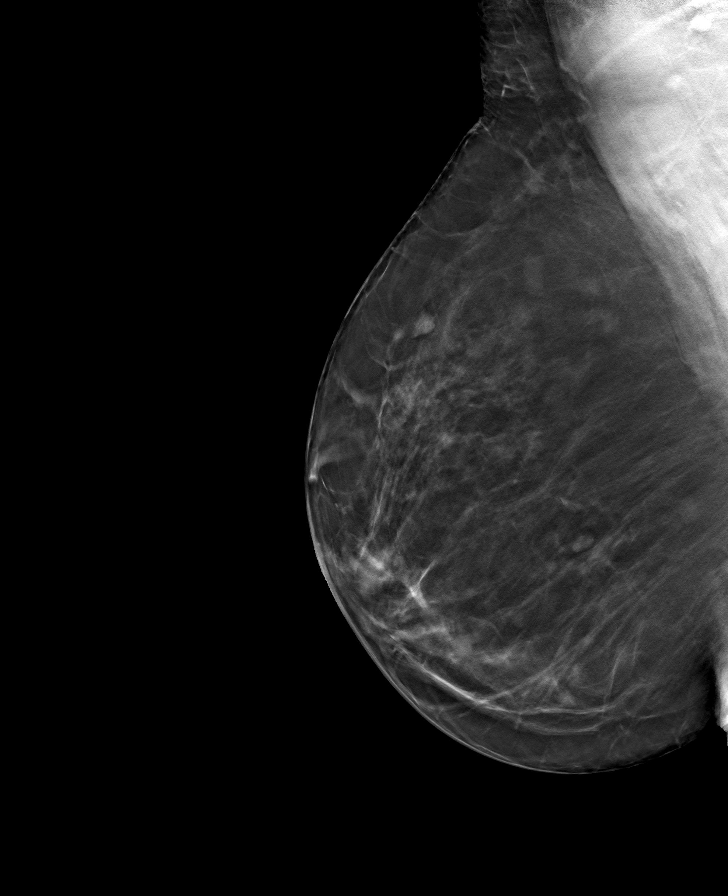

[L MLO tomo · tomo slice 40/79.0]
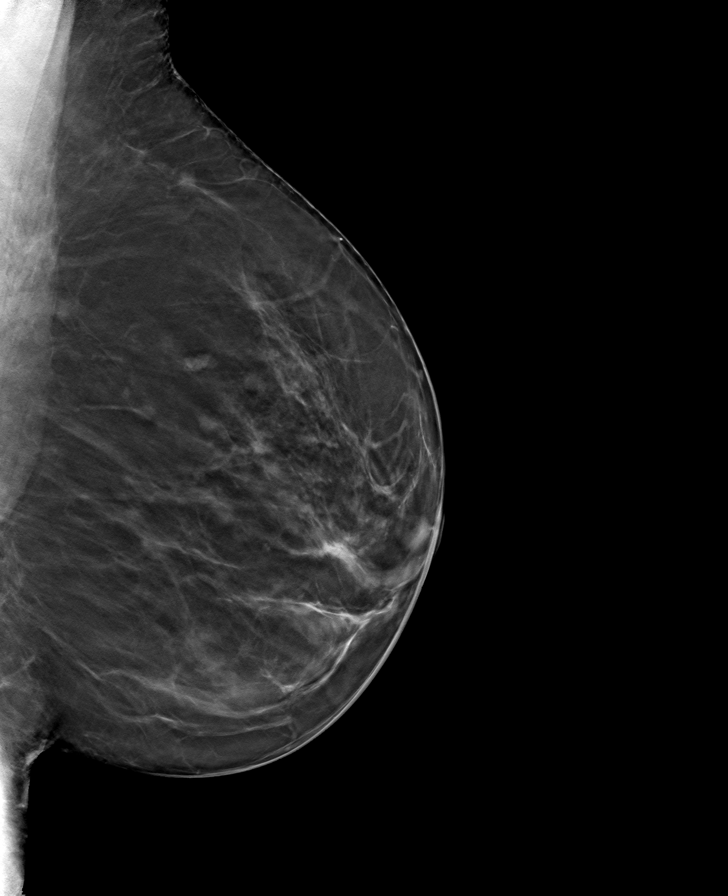

[8 of 24 positions shown; findings below may reference images not displayed]

ACR Breast Density Category b: There are scattered areas of
fibroglandular density.
FINDINGS: There are no findings suspicious for malignancy. Images were
processed with CAD.
IMPRESSION: No mammographic evidence of malignancy. A result letter of this
screening mammogram will be mailed directly to the patient.

RECOMMENDATION:
Screening mammogram in one year. (Code:[id number]-G-EJ6)

BI-RADS CATEGORY  1: Negative.

## 2020-08-02 ENCOUNTER — Other Ambulatory Visit: Payer: Self-pay | Admitting: Internal Medicine

## 2020-08-02 DIAGNOSIS — Z1231 Encounter for screening mammogram for malignant neoplasm of breast: Secondary | ICD-10-CM

## 2020-08-02 DIAGNOSIS — Z0000 Encounter for general adult medical examination without abnormal findings: Secondary | ICD-10-CM

## 2020-09-13 ENCOUNTER — Ambulatory Visit: Payer: BC Managed Care – PPO

## 2020-09-26 ENCOUNTER — Ambulatory Visit: Payer: BC Managed Care – PPO

## 2020-10-01 ENCOUNTER — Other Ambulatory Visit: Payer: Self-pay

## 2020-10-01 ENCOUNTER — Ambulatory Visit
Admission: RE | Admit: 2020-10-01 | Discharge: 2020-10-01 | Disposition: A | Discharge status: Home / Self Care | Payer: BC Managed Care – PPO | Source: Ambulatory Visit | Attending: Internal Medicine | Admitting: Internal Medicine

## 2020-10-01 DIAGNOSIS — Z1231 Encounter for screening mammogram for malignant neoplasm of breast: Secondary | ICD-10-CM

## 2020-11-01 ENCOUNTER — Inpatient Hospital Stay: Admission: RE | Admit: 2020-11-01 | Payer: BC Managed Care – PPO | Source: Ambulatory Visit

## 2020-12-16 ENCOUNTER — Other Ambulatory Visit: Payer: Self-pay | Admitting: Internal Medicine

## 2020-12-16 DIAGNOSIS — N6320 Unspecified lump in the left breast, unspecified quadrant: Secondary | ICD-10-CM

## 2020-12-19 ENCOUNTER — Inpatient Hospital Stay: Admission: RE | Admit: 2020-12-19 | Payer: BC Managed Care – PPO | Source: Ambulatory Visit

## 2021-10-06 ENCOUNTER — Ambulatory Visit: Payer: BC Managed Care – PPO | Admitting: Cardiology

## 2021-10-06 ENCOUNTER — Other Ambulatory Visit: Payer: Self-pay

## 2021-10-06 ENCOUNTER — Encounter: Payer: Self-pay | Admitting: Cardiology

## 2021-10-06 VITALS — BP 135/84 | HR 78 | Temp 98.0°F | Resp 16 | Ht 64.0 in | Wt 180.6 lb

## 2021-10-06 DIAGNOSIS — I491 Atrial premature depolarization: Secondary | ICD-10-CM

## 2021-10-06 DIAGNOSIS — R002 Palpitations: Secondary | ICD-10-CM

## 2021-10-06 DIAGNOSIS — I10 Essential (primary) hypertension: Secondary | ICD-10-CM

## 2021-10-06 DIAGNOSIS — R0683 Snoring: Secondary | ICD-10-CM

## 2021-10-06 MED ORDER — VERAPAMIL HCL ER 180 MG PO TBCR: 180.0000 mg | EXTENDED_RELEASE_TABLET | ORAL | 2 refills | Status: DC

## 2021-10-06 NOTE — Progress Notes (Signed)
Primary Physician/Referring:  Benito Mccreedy, MD  Patient ID: Christina Kirby, female    DOB: [birth year], 56 y.o.   MRN: [id number]  Chief Complaint  Patient presents with   Palpitations   New Patient (Initial Visit)    Referred by Benito Mccreedy, MD   HPI:    Christina Kirby  is a 56 y.o. African-American female patient with hypertension, hyperlipidemia, diabetes mellitus referred to me for evaluation of palpitations that started about 2 months ago, episode started after she had some questionable distress with regard to her son.  She is a Pharmacist, hospital by profession, states that she gets at least 7-10,000 steps a day without any chest pain or dyspnea.  She continues to have palpitations frequently.  No other associated symptoms.  Past Medical History:  Diagnosis Date   Blood transfusion without reported diagnosis    with pregnancy   Diabetes mellitus without complication ([place])    "pre diabetic" per pt's MD, taking Metformin now   Hyperlipidemia    no meds needed   Hypertension    Thalassemia    Past Surgical History:  Procedure Laterality Date   APPENDECTOMY     CESAREAN SECTION     Family History  Problem Relation Age of Onset   Colon cancer Neg Hx    Esophageal cancer Neg Hx    Rectal cancer Neg Hx    Stomach cancer Neg Hx     Social History   Tobacco Use   Smoking status: Never   Smokeless tobacco: Never  Substance Use Topics   Alcohol use: Yes    Alcohol/week: 1.0 standard drink    Types: 1 Glasses of wine per week    Comment: rare wine   Marital Status: Married  ROS  Review of Systems  Cardiovascular:  Positive for palpitations. Negative for chest pain, dyspnea on exertion and leg swelling.  Objective  Blood pressure 135/84, pulse 78, temperature 98 F (36.7 C), temperature source Temporal, resp. rate 16, height 5\' 4"  (1.626 m), weight 180 lb 9.6 oz (81.9 kg), SpO2 95 %. Body mass index is 31 kg/m.  Vitals with BMI 10/06/2021 09/21/2016  09/21/2016  Height 5\' 4"  - -  Weight 180 lbs 10 oz - -  BMI 32.67 - -  Systolic 124 580 998  Diastolic 84 86 80  Pulse 78 86 92    Physical Exam Constitutional:      Appearance: She is obese.  Neck:     Vascular: No carotid bruit or JVD.  Cardiovascular:     Rate and Rhythm: Normal rate and regular rhythm.     Pulses: Intact distal pulses.     Heart sounds: Normal heart sounds. No murmur heard.   No gallop.  Pulmonary:     Effort: Pulmonary effort is normal.     Breath sounds: Normal breath sounds.  Abdominal:     General: Bowel sounds are normal.     Palpations: Abdomen is soft.  Musculoskeletal:     Right lower leg: No edema.     Left lower leg: No edema.     Medications and allergies   Allergies  Allergen Reactions   Sulfa Antibiotics Hives     Medication list after today's encounter   Outpatient medication prior to visit Current Meds  Medication Sig   Ascorbic Acid (VITAMIN C) 1000 MG tablet Take 1,000 mg by mouth daily.   atorvastatin (LIPITOR) 10 MG tablet Take 10 mg by mouth daily.   Calcium-Magnesium-Vitamin D (CALCIUM  1200+D3 PO) Take 1 tablet by mouth daily.   Cholecalciferol (VITAMIN D) 50 MCG (2000 UT) CAPS Take 1 tablet by mouth daily.   famotidine (PEPCID) 20 MG tablet Take 20 mg by mouth 2 (two) times daily.   irbesartan-hydrochlorothiazide (AVALIDE) 150-12.5 MG tablet Take 1 tablet by mouth daily.   Multiple Vitamins-Minerals (MULTIVITAMIN ADULTS 50+) TABS Take by mouth daily.   propranolol (INDERAL) 10 MG tablet Take 10 mg by mouth 2 (two) times daily as needed (Palpitations).   verapamil (CALAN-SR) 180 MG CR tablet Take 1 tablet (180 mg total) by mouth every morning.   [DISCONTINUED] hydrochlorothiazide (HYDRODIURIL) 25 MG tablet Take 1 tablet (25 mg total) by mouth daily.      Current Outpatient Medications:    Ascorbic Acid (VITAMIN C) 1000 MG tablet, Take 1,000 mg by mouth daily., Disp: , Rfl:    atorvastatin (LIPITOR) 10 MG tablet, Take 10  mg by mouth daily., Disp: , Rfl:    Calcium-Magnesium-Vitamin D (CALCIUM 1200+D3 PO), Take 1 tablet by mouth daily., Disp: , Rfl:    Cholecalciferol (VITAMIN D) 50 MCG (2000 UT) CAPS, Take 1 tablet by mouth daily., Disp: , Rfl:    famotidine (PEPCID) 20 MG tablet, Take 20 mg by mouth 2 (two) times daily., Disp: , Rfl:    irbesartan-hydrochlorothiazide (AVALIDE) 150-12.5 MG tablet, Take 1 tablet by mouth daily., Disp: , Rfl:    Multiple Vitamins-Minerals (MULTIVITAMIN ADULTS 50+) TABS, Take by mouth daily., Disp: , Rfl:    propranolol (INDERAL) 10 MG tablet, Take 10 mg by mouth 2 (two) times daily as needed (Palpitations)., Disp: , Rfl:    verapamil (CALAN-SR) 180 MG CR tablet, Take 1 tablet (180 mg total) by mouth every morning., Disp: 30 tablet, Rfl: 2   metFORMIN (GLUCOPHAGE) 500 MG tablet, Take 1 tablet (500 mg total) by mouth daily with breakfast. (Patient not taking: Reported on 10/06/2021), Disp: 90 tablet, Rfl: 3  Laboratory examination:   No results for input(s): NA, K, CL, CO2, GLUCOSE, BUN, CREATININE, CALCIUM, GFRNONAA, GFRAA in the last 8760 hours. CrCl cannot be calculated (Patient's most recent lab result is older than the maximum 21 days allowed.).  CMP Latest Ref Rng & Units 09/07/2016 11/04/2015 11/03/2015  Glucose 65 - 99 mg/dL 106(H) 142(H) 120(H)  BUN 7 - 25 mg/dL 11 <5(L) 6  Creatinine 0.50 - 1.05 mg/dL 0.64 0.59 0.60  Sodium 135 - 146 mmol/L 139 137 138  Potassium 3.5 - 5.3 mmol/L 4.0 3.5 3.5  Chloride 98 - 110 mmol/L 105 107 107  CO2 20 - 31 mmol/L 25 25 24   Calcium 8.6 - 10.4 mg/dL 9.2 8.7(L) 8.6(L)  Total Protein 6.5 - 8.1 g/dL - - -  Total Bilirubin 0.3 - 1.2 mg/dL - - -  Alkaline Phos 38 - 126 U/L - - -  AST 15 - 41 U/L - - -  ALT 14 - 54 U/L - - -   CBC Latest Ref Rng & Units 09/07/2016 11/04/2015 11/03/2015  WBC 3.8 - 10.8 K/uL 3.4(L) 3.7(L) 3.5(L)  Hemoglobin 11.7 - 15.5 g/dL 11.0(L) 10.2(L) 10.2(L)  Hematocrit 35.0 - 45.0 % 36.0 33.2(L) 34.0(L)  Platelets 140  - 400 K/uL 135(L) 111(L) 110(L)    Lipid Panel No results for input(s): CHOL, TRIG, LDLCALC, VLDL, HDL, CHOLHDL, LDLDIRECT in the last 8760 hours. Lipid Panel     Component Value Date/Time   CHOL 206 (H) 09/07/2016 1153   TRIG 86 09/07/2016 1153   HDL 54 09/07/2016 1153  CHOLHDL 3.8 09/07/2016 1153   VLDL 17 09/07/2016 1153   LDLCALC 135 (H) 09/07/2016 1153     HEMOGLOBIN A1C Lab Results  Component Value Date   HGBA1C 5.9 09/07/2016   TSH No results for input(s): TSH in the last 8760 hours.  External labs:   Cholesterol, total 167.000 m 08/01/2021 HDL 40.000 mg 08/01/2021 LDL 114.000 m 08/01/2021 Triglycerides 64.000 mg 08/01/2021  A1C 6.900 % 08/01/2021  Creatinine, Serum 0.700 mg/ 01/25/2020 Potassium 4.100 mEq 08/01/2021 ALT (SGPT) 26.000 IU/ 08/01/2021  Hemoglobin 10.900 g/d 07/19/2019  TSH 01/18/2019: 1.11, normal.  Vitamin D 35.2.  Radiology:     Cardiac Studies:   A  EKG:   EKG 10/06/2021: Normal sinus rhythm with rate of 97 bpm, normal axis, incomplete right bundle branch block.  Frequent PACs (6).    Assessment     ICD-10-CM   1. Primary hypertension  I10 verapamil (CALAN-SR) 180 MG CR tablet    PCV ECHOCARDIOGRAM COMPLETE    TSH    2. Palpitations  R00.2 EKG 12-Lead    verapamil (CALAN-SR) 180 MG CR tablet    3. PAC (premature atrial contraction)  I49.1 Ambulatory referral to Sleep Studies    TSH    4. Loud snoring  R06.83 Ambulatory referral to Sleep Studies       Medications Discontinued During This Encounter  Medication Reason   0.9 %  sodium chloride infusion    ferrous sulfate (FERROUSUL) 325 (65 FE) MG tablet    hydrochlorothiazide (HYDRODIURIL) 25 MG tablet Discontinued by provider    Meds ordered this encounter  Medications   verapamil (CALAN-SR) 180 MG CR tablet    Sig: Take 1 tablet (180 mg total) by mouth every morning.    Dispense:  30 tablet    Refill:  2   Orders Placed This Encounter  Procedures   TSH   Ambulatory  referral to Sleep Studies    Referral Priority:   Routine    Referral Type:   Consultation    Referral Reason:   Specialty Services Required    Number of Visits Requested:   1   EKG 12-Lead   PCV ECHOCARDIOGRAM COMPLETE    Standing Status:   Future    Standing Expiration Date:   10/06/2022   Recommendations:   Cleota A Olivos is a 56 y.o. African-American female patient with hypertension, hyperlipidemia, diabetes mellitus referred to me for evaluation of palpitations that started about 2 months ago, episode started after she had some questionable distress with regard to her son.  Physical examination except for mild obesity is within normal limits.  EKG reveals frequent PACs.  On further questioning, she has loud snoring at night, excessive sweating at night, suspect she may have sleep apnea and may predispose to atrial fibrillation.  Like to obtain sleep study.  For now I have started her on verapamil 180 mg daily for hypertension and also PACs.  We will check TSH.  I tried to look at her labs in Care Everywhere and I cannot find TSH.  I will also obtain an echocardiogram to specifically evaluate for left atrial size and LV systolic function.  With regard hyperlipidemia, recently pravastatin has been switched to Lipitor, which is appropriate choice.  I will see her back in 6 weeks for follow-up.    Adrian Prows, MD, [facility] 10/06/2021, 3:08 PM Office: [phone]

## 2021-10-06 NOTE — Patient Instructions (Signed)
PAC = Premature atrial complexes: These arise from the upper chamber of the heart.  These are very common and are not dangerous.  Extra skipped beat coming from the upper chamber (atrium) and mostly are life altering (nuisance) than life threatening and mostly treated by reassurance.  There may not be any specific reasons for this, however patients with excessive caffeine, anxiety, lack of sleep, alcohol or thyroid problems can have these episodes.

## 2021-10-07 LAB — TSH: TSH: 0.817 u[IU]/mL (ref 0.450–4.500)

## 2021-10-09 ENCOUNTER — Other Ambulatory Visit: Payer: BC Managed Care – PPO

## 2021-11-17 ENCOUNTER — Other Ambulatory Visit: Payer: Self-pay

## 2021-11-17 ENCOUNTER — Encounter: Payer: Self-pay | Admitting: Cardiology

## 2021-11-17 ENCOUNTER — Ambulatory Visit: Payer: BC Managed Care – PPO | Admitting: Cardiology

## 2021-11-17 VITALS — BP 126/85 | HR 87 | Temp 97.8°F | Resp 16 | Ht 64.0 in | Wt 183.2 lb

## 2021-11-17 DIAGNOSIS — R002 Palpitations: Secondary | ICD-10-CM

## 2021-11-17 DIAGNOSIS — I10 Essential (primary) hypertension: Secondary | ICD-10-CM

## 2021-11-17 DIAGNOSIS — E7800 Pure hypercholesterolemia, unspecified: Secondary | ICD-10-CM

## 2021-11-17 MED ORDER — ATORVASTATIN CALCIUM 20 MG PO TABS: 20.0000 mg | ORAL_TABLET | Freq: Every day | ORAL | 0 refills | Status: AC

## 2021-11-17 NOTE — Progress Notes (Signed)
? ?Primary Physician/Referring:  Benito Mccreedy, MD ? ?Patient ID: Christina Kirby, female    DOB: 1965-09-05, 56 y.o.   MRN: [id number] ? ?Chief Complaint  ?Patient presents with  ? Hypertension  ? PAC  ? Follow-up  ?  6 weeks with repeat EKG  ? ?HPI:   ? ?Christina Kirby  is a 56 y.o. African-American female patient with hypertension, hyperlipidemia, diabetes mellitus referred to me for evaluation of palpitations that started about 2 months ago, episode started after she had some questionable distress with regard to her son. ? ?Patient was seen by me 6 weeks ago she now presents for follow-up.  She missed appointment for echocardiogram.  States that since I added verapamil and also since increasing the dose by her PCP, symptoms of palpitations have improved but she still persist and is concerned about this.  Again described as occasional skipped beats that makes her very nervous.  No other associated symptoms.  ? ?She is a Pharmacist, hospital by profession, states that she gets at least 7-10,000 steps a day without any chest pain or dyspnea.   ? ?Social History  ? ?Tobacco Use  ? Smoking status: Never  ? Smokeless tobacco: Never  ?Substance Use Topics  ? Alcohol use: Yes  ?  Alcohol/week: 1.0 standard drink  ?  Types: 1 Glasses of wine per week  ?  Comment: rare wine  ? ?Marital Status: Married  ?ROS  ?Review of Systems  ?Cardiovascular:  Positive for palpitations. Negative for chest pain, dyspnea on exertion and leg swelling.  ?Objective  ?Blood pressure 126/85, pulse 87, temperature 97.8 ?F (36.6 ?C), temperature source Temporal, resp. rate 16, height '5\' 4"'$  (1.626 m), weight 183 lb 3.2 oz (83.1 kg), SpO2 96 %. Body mass index is 31.45 kg/m?.  ? ?  11/17/2021  ?  3:23 PM 10/06/2021  ?  2:09 PM 09/21/2016  ? 10:20 AM  ?Vitals with BMI  ?Height '5\' 4"'$  '5\' 4"'$    ?Weight 183 lbs 3 oz 180 lbs 10 oz   ?BMI 31.43 30.98   ?Systolic 970 263 785  ?Diastolic 85 84 86  ?Pulse 87 78 86  ?  ?Physical Exam ?Constitutional:   ?    Appearance: She is obese.  ?Neck:  ?   Vascular: No carotid bruit or JVD.  ?Cardiovascular:  ?   Rate and Rhythm: Normal rate and regular rhythm.  ?   Pulses: Intact distal pulses.  ?   Heart sounds: Normal heart sounds. No murmur heard. ?  No gallop.  ?Pulmonary:  ?   Effort: Pulmonary effort is normal.  ?   Breath sounds: Normal breath sounds.  ?Abdominal:  ?   General: Bowel sounds are normal.  ?   Palpations: Abdomen is soft.  ?Musculoskeletal:  ?   Right lower leg: No edema.  ?   Left lower leg: No edema.  ?  ? ?Medications and allergies  ? ?Allergies  ?Allergen Reactions  ? Sulfa Antibiotics Hives  ?  ?Medication list after today's encounter  ? ? ?Current Outpatient Medications:  ?  Ascorbic Acid (VITAMIN C) 1000 MG tablet, Take 1,000 mg by mouth daily., Disp: , Rfl:  ?  Calcium-Magnesium-Vitamin D (CALCIUM 1200+D3 PO), Take 1 tablet by mouth daily., Disp: , Rfl:  ?  Cholecalciferol (VITAMIN D) 50 MCG (2000 UT) CAPS, Take 1 tablet by mouth daily., Disp: , Rfl:  ?  famotidine (PEPCID) 20 MG tablet, Take 20 mg by mouth 2 (two) times daily., Disp: ,  Rfl:  ?  hydrochlorothiazide (HYDRODIURIL) 25 MG tablet, Take 25 mg by mouth daily., Disp: , Rfl:  ?  losartan (COZAAR) 50 MG tablet, Take 50 mg by mouth daily., Disp: , Rfl:  ?  Multiple Vitamins-Minerals (MULTIVITAMIN ADULTS 50+) TABS, Take by mouth daily., Disp: , Rfl:  ?  propranolol (INDERAL) 10 MG tablet, Take 10 mg by mouth 2 (two) times daily as needed (Palpitations)., Disp: , Rfl:  ?  verapamil (CALAN-SR) 240 MG CR tablet, Take 240 mg by mouth every morning., Disp: , Rfl:  ?  atorvastatin (LIPITOR) 20 MG tablet, Take 1 tablet (20 mg total) by mouth daily., Disp: 90 tablet, Rfl: 0 ? ?Laboratory examination:  ? ?No results for input(s): NA, K, CL, CO2, GLUCOSE, BUN, CREATININE, CALCIUM, GFRNONAA, GFRAA in the last 8760 hours. ?CrCl cannot be calculated (Patient's most recent lab result is older than the maximum 21 days allowed.).  ? ?  Latest Ref Rng & Units  09/07/2016  ? 11:53 AM 11/04/2015  ?  5:25 AM 11/03/2015  ?  6:31 AM  ?CMP  ?Glucose 65 - 99 mg/dL 106   142   120    ?BUN 7 - 25 mg/dL 11   <5   6    ?Creatinine 0.50 - 1.05 mg/dL 0.64   0.59   0.60    ?Sodium 135 - 146 mmol/L 139   137   138    ?Potassium 3.5 - 5.3 mmol/L 4.0   3.5   3.5    ?Chloride 98 - 110 mmol/L 105   107   107    ?CO2 20 - 31 mmol/L '25   25   24    '$ ?Calcium 8.6 - 10.4 mg/dL 9.2   8.7   8.6    ? ? ?  Latest Ref Rng & Units 09/07/2016  ? 11:53 AM 11/04/2015  ?  5:25 AM 11/03/2015  ?  6:31 AM  ?CBC  ?WBC 3.8 - 10.8 K/uL 3.4   3.7   3.5    ?Hemoglobin 11.7 - 15.5 g/dL 11.0   10.2   10.2    ?Hematocrit 35.0 - 45.0 % 36.0   33.2   34.0    ?Platelets 140 - 400 K/uL 135   111   110    ? ?Lipid Panel  ?   ?Component Value Date/Time  ? CHOL 206 (H) 09/07/2016 1153  ? TRIG 86 09/07/2016 1153  ? HDL 54 09/07/2016 1153  ? CHOLHDL 3.8 09/07/2016 1153  ? VLDL 17 09/07/2016 1153  ? LDLCALC 135 (H) 09/07/2016 1153  ?  ? ?HEMOGLOBIN A1C ?Lab Results  ?Component Value Date  ? HGBA1C 5.9 09/07/2016  ? ?TSH ?Recent Labs  ?  10/06/21 ?1556  ?TSH 0.817  ? ? ?External labs:  ? ?Cholesterol, total 167.000 m 08/01/2021 ?HDL 40.000 mg 08/01/2021 ?LDL 114.000 m 08/01/2021 ?Triglycerides 64.000 mg 08/01/2021 ? ?A1C 6.900 % 08/01/2021 ? ?Creatinine, Serum 0.700 mg/ 01/25/2020 ?Potassium 4.100 mEq 08/01/2021 ?ALT (SGPT) 26.000 IU/ 08/01/2021 ? ?Hemoglobin 10.900 g/d 07/19/2019 ? ?TSH 01/18/2019: ?1.11, normal.  Vitamin D 35.2. ? ?Radiology:  ? ? ? ?Cardiac Studies:  ? ?Echo - Missed appointment ?   ?EKG:  ? ?EKG 11/17/2021: Normal sinus rhythm at rate of 78 bpm, left atrial enlargement.  Otherwise normal EKG. ? ?EKG 10/06/2021: Normal sinus rhythm with rate of 97 bpm, normal axis, incomplete right bundle branch block.  Frequent PACs (6).   ? ?Assessment  ? ?  ICD-10-CM   ?  1. Primary hypertension  I10 EKG 12-Lead  ?  ?2. Palpitations  R00.2   ?  ?3. Pure hypercholesterolemia  E78.00 atorvastatin (LIPITOR) 20 MG tablet  ?  ?  ? ?Medications  Discontinued During This Encounter  ?Medication Reason  ? verapamil (CALAN-SR) 180 MG CR tablet Dose change  ? metFORMIN (GLUCOPHAGE) 500 MG tablet Discontinued by provider  ? irbesartan-hydrochlorothiazide (AVALIDE) 150-12.5 MG tablet Change in therapy  ? atorvastatin (LIPITOR) 10 MG tablet Reorder  ? ?  ?Meds ordered this encounter  ?Medications  ? atorvastatin (LIPITOR) 20 MG tablet  ?  Sig: Take 1 tablet (20 mg total) by mouth daily.  ?  Dispense:  90 tablet  ?  Refill:  0  ?  Refills to [facility]  ? ?Orders Placed This Encounter  ?Procedures  ? EKG 12-Lead  ? ?Recommendations:  ? ?Christina Kirby is a 56 y.o. African-American female patient with hypertension, hyperlipidemia, diabetes mellitus referred to me for evaluation of palpitations that started about 2 months ago, episode started after she had some questionable distress with regard to her son. ? ?Patient was seen by me 6 weeks ago she now presents for follow-up.  She missed appointment for echocardiogram.  States that since I added verapamil and also since increasing the dose by her PCP, symptoms of palpitations have improved but she still persist and is concerned about this.  Again described as occasional skipped beats that makes her very nervous.  No other associated symptoms. ? ?She continues to deny any chest pain or dyspnea, states that she is a Pharmacist, hospital and is at very active and busy physically without any symptoms.  I again reassured her.  She is concerned about cardiovascular disease, I had a long discussion with the patient that she denies any symptoms of dyspnea or chest pain, states that she gets at least greater than 9-10,000 steps a day without any limitations, her only symptoms being palpitations that started about 2 to 3 months ago after a stressful situation in the family matters. ? ?Advised her that she could continue to use propranolol that she uses for anxiety on a as needed basis for palpitations.  Patient does not want to be  on too many medications. ? ?Advised her to try vitamin B1, B6 and B12 supplements and if this does not work she could also try fish oil capsules.  Propranolol for breakthrough palpitation is also a good choic

## 2021-11-17 NOTE — Patient Instructions (Signed)
PAC = Premature atrial complexes: These arise from the upper chamber of the heart.  These are very common and are not dangerous.  Extra skipped beat coming from the upper chamber (atrium) and mostly are life altering (nuisance) than life threatening and mostly treated by reassurance. ? ?There may not be any specific reasons for this, however patients with excessive caffeine, anxiety, lack of sleep, alcohol or thyroid problems can have these episodes.  ? ?Palpitations, symptoms suggest PAC/PVC:  I will emperically try Vit B1 (Thiamine) 50 mg, Vit B6 (Pyrodoxine) 50 mg and Vit B12 (rappid release) 1044mg, twice daily for palpitations and tachycardia.  ? ? ?Consider Fish oil capsule daily with food.  ?

## 2021-11-24 ENCOUNTER — Other Ambulatory Visit: Payer: BC Managed Care – PPO

## 2021-11-26 ENCOUNTER — Encounter: Payer: Self-pay | Admitting: Neurology

## 2021-11-26 ENCOUNTER — Ambulatory Visit: Payer: BC Managed Care – PPO | Admitting: Neurology

## 2021-11-26 VITALS — BP 142/87 | HR 68 | Ht 65.0 in | Wt 184.6 lb

## 2021-11-26 DIAGNOSIS — Z9189 Other specified personal risk factors, not elsewhere classified: Secondary | ICD-10-CM

## 2021-11-26 DIAGNOSIS — R0683 Snoring: Secondary | ICD-10-CM | POA: Diagnosis not present

## 2021-11-26 DIAGNOSIS — R002 Palpitations: Secondary | ICD-10-CM

## 2021-11-26 DIAGNOSIS — I491 Atrial premature depolarization: Secondary | ICD-10-CM

## 2021-11-26 DIAGNOSIS — E669 Obesity, unspecified: Secondary | ICD-10-CM | POA: Diagnosis not present

## 2021-11-26 NOTE — Progress Notes (Signed)
Subjective:  ?  ?Patient ID: Christina Kirby is a 56 y.o. female. ? ?HPI ? ? ? ?Star Age, MD, PhD ?[facility] ?[address], [address] ?[phone] ?[place], [place] [address] ? ?Dear Ulice Dash,  ? ?I saw your patient, Christina Kirby, upon your kind request in my sleep clinic today for initial consultation of her sleep disorder, in particular, concern for underlying obstructive sleep apnea.  The patient is unaccompanied today.  As you know, Christina Kirby is a 56 year old right-handed woman with an underlying medical history of thalassemia, hypertension, hyperlipidemia, prediabetes, palpitations, PACs, and mild obesity, who reports snoring and some daytime tiredness.  I reviewed your office note from 11/17/2021.  Her Epworth sleepiness score is 1 out of 24, fatigue severity score is 19 out of 63.  She is currently no longer taking her losartan as her primary care increased her verapamil.  She does not currently take her propranolol.  She reports a bedtime between 10 and 11 PM and rise time around 5 AM.  She works full-time as a Energy manager.  She lives with her husband and her daughter also lives with them.  They also have a son.  She has 1 cat in the household, no TV in her bedroom.  She limits her caffeine to 1 cup of coffee at most, not necessarily daily.  She drinks alcohol on special occasions only and is a non-smoker.  She is not aware of any family history of sleep apnea. ? ?Her Past Medical History Is Significant For: ?Past Medical History:  ?Diagnosis Date  ? Blood transfusion without reported diagnosis   ? with pregnancy  ? Diabetes mellitus without complication ([facility])   ? "pre diabetic" per pt's MD, taking Metformin now  ? Hyperlipidemia   ? no meds needed  ? Hypertension   ? Thalassemia   ? ? ?Her Past Surgical History Is Significant For: ?Past Surgical History:  ?Procedure Laterality Date  ? APPENDECTOMY    ? CESAREAN SECTION    ? ? ?Her Family History Is  Significant For: ?Family History  ?Problem Relation Age of Onset  ? Colon cancer Neg Hx   ? Esophageal cancer Neg Hx   ? Rectal cancer Neg Hx   ? Stomach cancer Neg Hx   ? ? ?Her Social History Is Significant For: ?Social History  ? ?Socioeconomic History  ? Marital status: Married  ?  Spouse name: Not on file  ? Number of children: 2  ? Years of education: Not on file  ? Highest education level: Not on file  ?Occupational History  ? Not on file  ?Tobacco Use  ? Smoking status: Never  ? Smokeless tobacco: Never  ?Vaping Use  ? Vaping Use: Never used  ?Substance and Sexual Activity  ? Alcohol use: Yes  ?  Alcohol/week: 1.0 standard drink  ?  Types: 1 Glasses of wine per week  ?  Comment: rare wine  ? Drug use: No  ? Sexual activity: Not on file  ?Other Topics Concern  ? Not on file  ?Social History Narrative  ? Caffeine coffee - 1 cup 3-4 wks.  Soda's- none.  Sweet tea - occasional.  Education Master ED (graduate PhD working on this).  WSCS 8th grade science.   ? ?Social Determinants of Health  ? ?Financial Resource Strain: Not on file  ?Food Insecurity: Not on file  ?Transportation Needs: Not on file  ?Physical Activity: Not on file  ?Stress: Not on file  ?  Social Connections: Not on file  ? ? ?Her Allergies Are:  ?Allergies  ?Allergen Reactions  ? Sulfa Antibiotics Hives  ?:  ? ?Her Current Medications Are:  ?Outpatient Encounter Medications as of 11/26/2021  ?Medication Sig  ? Ascorbic Acid (VITAMIN C) 1000 MG tablet Take 1,000 mg by mouth daily.  ? atorvastatin (LIPITOR) 20 MG tablet Take 1 tablet (20 mg total) by mouth daily.  ? Calcium-Magnesium-Vitamin D (CALCIUM 1200+D3 PO) Take 1 tablet by mouth daily.  ? Cholecalciferol (VITAMIN D) 50 MCG (2000 UT) CAPS Take 1 tablet by mouth daily.  ? famotidine (PEPCID) 20 MG tablet Take 20 mg by mouth 2 (two) times daily.  ? hydrochlorothiazide (HYDRODIURIL) 25 MG tablet Take 25 mg by mouth daily.  ? losartan (COZAAR) 50 MG tablet Take 50 mg by mouth daily.  ? Multiple  Vitamins-Minerals (MULTIVITAMIN ADULTS 50+) TABS Take by mouth daily.  ? propranolol (INDERAL) 10 MG tablet Take 10 mg by mouth 2 (two) times daily as needed (Palpitations).  ? verapamil (CALAN-SR) 240 MG CR tablet Take 240 mg by mouth every morning.  ? ?No facility-administered encounter medications on file as of 11/26/2021.  ?: ? ? ?Review of Systems:  ?Out of a complete 14 point review of systems, all are reviewed and negative with the exception of these symptoms as listed below:  ? ?Review of Systems  ?Neurological:   ?     Loud snoring, palpitations (cardiology checked out ok).  Has hypertension.  No family hx of OSA.   ESS 1, FSS 19.    ? ?Objective:  ?Neurological Exam ? ?Physical Exam ?Physical Examination:  ? ?Vitals:  ? 11/26/21 1010  ?BP: (!) 142/87  ?Pulse: 68  ? ? ?General Examination: The patient is a very pleasant 56 y.o. female in no acute distress. She appears well-developed and well-nourished and well groomed.  ? ?HEENT: Normocephalic, atraumatic, pupils are equal, round and reactive to light, extraocular tracking is good without limitation to gaze excursion or nystagmus noted. Hearing is grossly intact. Face is symmetric with normal facial animation. Speech is clear with no dysarthria noted. There is no hypophonia. There is no lip, neck/head, jaw or voice tremor. Neck is supple with full range of passive and active motion. There are no carotid bruits on auscultation. Oropharynx exam reveals: mild mouth dryness, good dental hygiene and moderate airway crowding, due to tonsillar size of about 2-3+, Mallampati class II, neck circumference of 14-5/8 inches.  She has a mild to moderate overbite.  Tongue protrudes centrally and palate elevates symmetrically. ? ?Chest: Clear to auscultation without wheezing, rhonchi or crackles noted. ? ?Heart: S1+S2+0, regular and normal without murmurs, rubs or gallops noted.  ? ?Abdomen: Soft, non-tender and non-distended. ? ?Extremities: There is no obvious edema.    ? ?Skin: Warm and dry without trophic changes noted.  ? ?Musculoskeletal: exam reveals no obvious joint deformities.  ? ?Neurologically:  ?Mental status: The patient is awake, alert and oriented in all 4 spheres. Her immediate and remote memory, attention, language skills and fund of knowledge are appropriate. There is no evidence of aphasia, agnosia, apraxia or anomia. Speech is clear with normal prosody and enunciation. Thought process is linear. Mood is normal and affect is normal.  ?Cranial nerves II - XII are as described above under HEENT exam.  ?Motor exam: Normal bulk, strength and tone is noted. There is no obvious tremor.  ?Fine motor skills and coordination: grossly intact.  ?Cerebellar testing: No dysmetria or intention tremor. There  is no truncal or gait ataxia.  ?Sensory exam: intact to light touch in the upper and lower extremities.  ?Gait, station and balance: She stands easily. No veering to one side is noted. No leaning to one side is noted. Posture is age-appropriate and stance is narrow based. Gait shows normal stride length and normal pace. No problems turning are noted.  ? ?Assessment and Plan:  ?In summary, Christina Kirby is a very pleasant 56 y.o.-year old female with an underlying medical history of thalassemia, hypertension, hyperlipidemia, prediabetes, palpitations, PACs, and mild obesity, whose history and physical exam are concerning for obstructive sleep apnea (OSA). ?I had a long chat with the patient a about my findings and the diagnosis of OSA, its prognosis and treatment options. We talked about medical treatments, surgical interventions and non-pharmacological approaches. I explained in particular the risks and ramifications of untreated moderate to severe OSA, especially with respect to developing cardiovascular disease down the Road, including congestive heart failure, difficult to treat hypertension, cardiac arrhythmias, or stroke. Even type 2 diabetes has, in part, been  linked to untreated OSA. Symptoms of untreated OSA include daytime sleepiness, memory problems, mood irritability and mood disorder such as depression and anxiety, lack of energy, as well as recurrent headaches, espe

## 2021-11-26 NOTE — Patient Instructions (Signed)

## 2021-12-10 ENCOUNTER — Institutional Professional Consult (permissible substitution): Payer: BC Managed Care – PPO | Admitting: Neurology

## 2021-12-15 ENCOUNTER — Ambulatory Visit: Payer: BC Managed Care – PPO

## 2021-12-15 ENCOUNTER — Ambulatory Visit (INDEPENDENT_AMBULATORY_CARE_PROVIDER_SITE_OTHER): Payer: BC Managed Care – PPO | Admitting: Neurology

## 2021-12-15 DIAGNOSIS — R0683 Snoring: Secondary | ICD-10-CM

## 2021-12-15 DIAGNOSIS — G4720 Circadian rhythm sleep disorder, unspecified type: Secondary | ICD-10-CM

## 2021-12-15 DIAGNOSIS — R002 Palpitations: Secondary | ICD-10-CM

## 2021-12-15 DIAGNOSIS — I491 Atrial premature depolarization: Secondary | ICD-10-CM

## 2021-12-15 DIAGNOSIS — E66811 Obesity, class 1: Secondary | ICD-10-CM

## 2021-12-15 DIAGNOSIS — E669 Obesity, unspecified: Secondary | ICD-10-CM

## 2021-12-15 DIAGNOSIS — I10 Essential (primary) hypertension: Secondary | ICD-10-CM

## 2021-12-15 DIAGNOSIS — Z9189 Other specified personal risk factors, not elsewhere classified: Secondary | ICD-10-CM

## 2021-12-19 NOTE — Procedures (Signed)
PATIENT'S NAME:  Christina Kirby, Christina Kirby ?DOB:      Jun 16, 1966      ?MR#:    [id number]     ?DATE OF RECORDING: 12/15/2021 ?REFERRING M.D.:  Adrian Prows, MD ?Study Performed:   Baseline Polysomnogram ?HISTORY: 56 year old with a history of thalassemia, hypertension, hyperlipidemia, prediabetes, palpitations, PACs, and mild obesity, who reports snoring and some daytime tiredness. The patient endorsed the Epworth Sleepiness Scale at 1 points. The patient's weight 184 pounds with a height of 65 (inches), resulting in a BMI of 30.5 kg/m2. The patient's neck circumference measured 14.5 inches. ? ?CURRENT MEDICATIONS: Vitamin C, Lipitor, Calcium +d3, Vitamin D, Pepcid, Hydrodiuril, Cozaar, Multivitamin, Inderal, Calan-SR ?  ?PROCEDURE:  This is a multichannel digital polysomnogram utilizing the Somnostar 11.2 system.  Electrodes and sensors were applied and monitored per AASM Specifications.   EEG, EOG, Chin and Limb EMG, were sampled at 200 Hz.  ECG, Snore and Nasal Pressure, Thermal Airflow, Respiratory Effort, CPAP Flow and Pressure, Oximetry was sampled at 50 Hz. Digital video and audio were recorded.     ? ?BASELINE STUDY ? ?Lights Out was at 20:49 and Lights On at 04:59.  Total recording time (TRT) was 491 minutes, with a total sleep time (TST) of 421 minutes.   The patient's sleep latency was 17.5 minutes, which is normal.  REM latency was 88 minutes, which is normal.  The sleep efficiency was 85.7 %.  ?   ?SLEEP ARCHITECTURE: WASO (Wake after sleep onset) was 50.5 minutes with mild sleep fragmentation noted.  There were 7.5 minutes in Stage N1, 252 minutes Stage N2, 72 minutes Stage N3 and 89.5 minutes in Stage REM.  The percentage of Stage N1 was 1.8%, Stage N2 was 59.9%, which is mildly increased, Stage N3 was 17.1%, which is normal, and Stage R (REM sleep) was 21.3%, which is normal.  ?The arousals were noted as: 35 were spontaneous, 0 were associated with PLMs, 5 were associated with respiratory events. ? ?RESPIRATORY  ANALYSIS:  There were a total of 11 respiratory events:  3 obstructive apneas, 0 central apneas and 0 mixed apneas with a total of 3 apneas and an apnea index (AI) of .4 /hour. There were 8 hypopneas with a hypopnea index of 1.1 /hour. The patient also had 0 respiratory event related arousals (RERAs).  ?    ?The total APNEA/HYPOPNEA INDEX (AHI) was 1.6/hour and the total RESPIRATORY DISTURBANCE INDEX was  1.6 /hour.  8 events occurred in REM sleep and 2 events in NREM. The REM AHI was  5.4 /hour, versus a non-REM AHI of .5. The patient spent 0 minutes of total sleep time in the supine position and 421 minutes in non-supine.. The supine AHI was n/a versus a non-supine AHI of 1.5. ? ?OXYGEN SATURATION & C02:  The Wake baseline 02 saturation was 95%, with the lowest being 89%. Time spent below 89% saturation equaled 0 minutes. ? ?PERIODIC LIMB MOVEMENTS: The patient had a total of 0 Periodic Limb Movements.  The Periodic Limb Movement (PLM) index was 0 and the PLM Arousal index was 0/hour. ? ?Audio and video analysis did not show any abnormal or unusual movements, behaviors, phonations or vocalizations. The patient took 1 bathroom break. Mild snoring was noted. The EKG was in keeping with normal sinus rhythm (NSR). ? ?Post-study, the patient indicated that sleep was worse than usual.  ? ?IMPRESSION: ? ?Primary Snoring ?Dysfunctions associated with sleep stages or arousal from sleep ? ?RECOMMENDATIONS: ? ?This study does not demonstrate any  significant obstructive or central sleep disordered breathing with the exception of mild snoring and mild REM sleep related OSA. The absence of supine sleep may underestimate her sleep disordered breathing to some degree. Treatment with a positive airway pressure device, such as CPAP or autoPAP is not indicated. Some weight loss and ongoing avoidance of the supine sleep position may reduce her snoring and stage-related OSA.  ?This study shows mild sleep fragmentation but near-normal  sleep stage percentages; these are nonspecific findings and per se do not signify an intrinsic sleep disorder or a cause for the patient's sleep-related symptoms. Causes include (but are not limited to) the first night effect of the sleep study, circadian rhythm disturbances, medication effect or an underlying mood disorder or medical problem.  ?The patient should be cautioned not to drive, work at heights, or operate dangerous or heavy equipment when tired or sleepy. Review and reiteration of good sleep hygiene measures should be pursued with any patient. ?The patient will be advised to follow up with the referring provider, as planned/scheduled; the patient will be notified of the test results. ? ?I certify that I have reviewed the entire raw data recording prior to the issuance of this report in accordance with the Standards of Accreditation of the [place] [employer] (AASM) ? ?Star Age, MD, PhD ?Diplomat, American Board of Neurology and Sleep Medicine (Neurology and Sleep Medicine) ? ? ? ?

## 2021-12-22 ENCOUNTER — Telehealth: Payer: Self-pay

## 2021-12-22 NOTE — Telephone Encounter (Signed)
I called patient to discuss. No answer, left a message asking her to  call me back. If patient returns call another day please route to POD 4. 

## 2021-12-22 NOTE — Telephone Encounter (Signed)
-----   Message from Star Age, MD sent at 12/19/2021  2:23 PM EDT ----- ?Patient referred by Dr. Einar Gip, seen by me on 11/26/21, diagnostic PSG on 12/15/21.   ?Please call and notify the patient that the recent sleep study did not show any significant obstructive sleep apnea with the exception of mild snoring and mild REM sleep related OSA. The absence of supine sleep may have underestimated her sleep apnea during this study to some degree. Treatment with a positive airway pressure device, such as CPAP or autoPAP is not indicated. Some weight loss and ongoing avoidance of the supine sleep position may reduce her snoring and REM sleep related OSA.  ?She can FU with her PCP and other providers as scheduled/planned.  ?Thanks, ? ?Star Age, MD, PhD ?[facility]) ? ?

## 2021-12-23 NOTE — Telephone Encounter (Signed)
Pt returned the call, would like a call back.  ?

## 2021-12-23 NOTE — Telephone Encounter (Signed)
I called patient again to discuss. No answer, left a message asking her to call us back. If patient returns call another day please route to POD 4. ?

## 2021-12-23 NOTE — Telephone Encounter (Signed)
I called patient. She reports that she reviewed her sleep study results online and did not have any questions. She will follow up with PCP as recommended. Pt verbalized understanding of results. Pt had no questions at this time but was encouraged to call back if questions arise. ? ?
